# Patient Record
Sex: Female | Born: 1955 | Hispanic: No | Marital: Single
Health system: Northeastern US, Academic
[De-identification: ages and names within clinical notes are randomized; demographics above are authoritative.]

## PROBLEM LIST (undated history)

## (undated) DIAGNOSIS — L309 Dermatitis, unspecified: Secondary | ICD-10-CM

## (undated) DIAGNOSIS — R011 Cardiac murmur, unspecified: Secondary | ICD-10-CM

## (undated) DIAGNOSIS — G4700 Insomnia, unspecified: Secondary | ICD-10-CM

## (undated) DIAGNOSIS — K219 Gastro-esophageal reflux disease without esophagitis: Secondary | ICD-10-CM

## (undated) DIAGNOSIS — M419 Scoliosis, unspecified: Secondary | ICD-10-CM

## (undated) DIAGNOSIS — E785 Hyperlipidemia, unspecified: Secondary | ICD-10-CM

## (undated) DIAGNOSIS — I341 Nonrheumatic mitral (valve) prolapse: Secondary | ICD-10-CM

## (undated) DIAGNOSIS — H409 Unspecified glaucoma: Secondary | ICD-10-CM

## (undated) DIAGNOSIS — J189 Pneumonia, unspecified organism: Secondary | ICD-10-CM

## (undated) DIAGNOSIS — I10 Essential (primary) hypertension: Secondary | ICD-10-CM

## (undated) DIAGNOSIS — R0602 Shortness of breath: Secondary | ICD-10-CM

## (undated) HISTORY — DX: Insomnia, unspecified: G47.00

## (undated) HISTORY — DX: Essential (primary) hypertension: I10

## (undated) HISTORY — PX: ANAL FISSURE REPAIR: SHX2312

## (undated) HISTORY — DX: Hyperlipidemia, unspecified: E78.5

## (undated) HISTORY — DX: Scoliosis, unspecified: M41.9

## (undated) HISTORY — DX: Nonrheumatic mitral (valve) prolapse: I34.1

## (undated) HISTORY — PX: FINGER SURGERY: SHX640

---

## 2006-07-23 ENCOUNTER — Encounter (INDEPENDENT_AMBULATORY_CARE_PROVIDER_SITE_OTHER): Payer: Self-pay | Admitting: Family Medicine

## 2006-07-23 LAB — CONVERTED CEMR LAB

## 2006-10-06 ENCOUNTER — Emergency Department (HOSPITAL_COMMUNITY): Admission: EM | Admit: 2006-10-06 | Discharge: 2006-10-06 | Payer: Self-pay | Admitting: Emergency Medicine

## 2006-11-15 ENCOUNTER — Ambulatory Visit: Payer: Self-pay | Admitting: Family Medicine

## 2006-11-19 ENCOUNTER — Ambulatory Visit: Payer: Self-pay | Admitting: *Deleted

## 2007-01-14 ENCOUNTER — Ambulatory Visit: Payer: Self-pay | Admitting: Family Medicine

## 2007-04-18 ENCOUNTER — Ambulatory Visit: Payer: Self-pay | Admitting: Internal Medicine

## 2007-04-25 ENCOUNTER — Ambulatory Visit: Payer: Self-pay | Admitting: Internal Medicine

## 2007-05-20 ENCOUNTER — Ambulatory Visit: Payer: Self-pay | Admitting: Family Medicine

## 2007-05-20 LAB — TB SKIN TEST
Induration: NEGATIVE mm
TB Skin Test: NEGATIVE

## 2007-05-22 ENCOUNTER — Ambulatory Visit: Payer: Self-pay | Admitting: Internal Medicine

## 2007-06-04 ENCOUNTER — Ambulatory Visit: Payer: Self-pay | Admitting: Internal Medicine

## 2007-06-05 ENCOUNTER — Encounter (INDEPENDENT_AMBULATORY_CARE_PROVIDER_SITE_OTHER): Payer: Self-pay | Admitting: Internal Medicine

## 2007-06-05 LAB — CONVERTED CEMR LAB
AST: 22 units/L (ref 0–37)
BUN: 10 mg/dL (ref 6–23)
CO2: 26 meq/L (ref 19–32)
Creatinine, Ser: 0.69 mg/dL (ref 0.40–1.20)
Potassium: 3.9 meq/L (ref 3.5–5.3)
Total Bilirubin: 0.5 mg/dL (ref 0.3–1.2)
Total CHOL/HDL Ratio: 3.7
Total Protein: 7.4 g/dL (ref 6.0–8.3)
Triglycerides: 106 mg/dL (ref <150)

## 2007-07-10 ENCOUNTER — Encounter (INDEPENDENT_AMBULATORY_CARE_PROVIDER_SITE_OTHER): Payer: Self-pay | Admitting: *Deleted

## 2007-07-22 ENCOUNTER — Ambulatory Visit: Payer: Self-pay | Admitting: Internal Medicine

## 2007-07-22 ENCOUNTER — Ambulatory Visit (HOSPITAL_COMMUNITY): Admission: RE | Admit: 2007-07-22 | Discharge: 2007-07-22 | Payer: Self-pay | Admitting: Family Medicine

## 2007-07-24 ENCOUNTER — Emergency Department (HOSPITAL_COMMUNITY): Admission: EM | Admit: 2007-07-24 | Discharge: 2007-07-24 | Payer: Self-pay | Admitting: Family Medicine

## 2007-07-29 ENCOUNTER — Encounter (INDEPENDENT_AMBULATORY_CARE_PROVIDER_SITE_OTHER): Payer: Self-pay | Admitting: Family Medicine

## 2007-07-29 DIAGNOSIS — I059 Rheumatic mitral valve disease, unspecified: Secondary | ICD-10-CM | POA: Insufficient documentation

## 2007-07-29 DIAGNOSIS — M4180 Other forms of scoliosis, site unspecified: Secondary | ICD-10-CM

## 2007-07-29 DIAGNOSIS — E785 Hyperlipidemia, unspecified: Secondary | ICD-10-CM

## 2007-07-29 DIAGNOSIS — F17200 Nicotine dependence, unspecified, uncomplicated: Secondary | ICD-10-CM

## 2007-07-29 DIAGNOSIS — I10 Essential (primary) hypertension: Secondary | ICD-10-CM | POA: Insufficient documentation

## 2007-07-29 DIAGNOSIS — H409 Unspecified glaucoma: Secondary | ICD-10-CM | POA: Insufficient documentation

## 2007-08-18 ENCOUNTER — Emergency Department (HOSPITAL_COMMUNITY): Admission: EM | Admit: 2007-08-18 | Discharge: 2007-08-18 | Payer: Self-pay | Admitting: Family Medicine

## 2007-09-09 ENCOUNTER — Ambulatory Visit: Payer: Self-pay | Admitting: Internal Medicine

## 2007-09-18 ENCOUNTER — Encounter (INDEPENDENT_AMBULATORY_CARE_PROVIDER_SITE_OTHER): Payer: Self-pay | Admitting: Family Medicine

## 2007-09-18 ENCOUNTER — Ambulatory Visit: Payer: Self-pay | Admitting: Internal Medicine

## 2007-09-18 LAB — CONVERTED CEMR LAB
Cholesterol: 190 mg/dL (ref 0–200)
VLDL: 22 mg/dL (ref 0–40)

## 2007-10-28 ENCOUNTER — Ambulatory Visit: Payer: Self-pay | Admitting: Family Medicine

## 2007-11-08 ENCOUNTER — Emergency Department (HOSPITAL_COMMUNITY): Admission: EM | Admit: 2007-11-08 | Discharge: 2007-11-08 | Payer: Self-pay | Admitting: Emergency Medicine

## 2007-11-27 ENCOUNTER — Encounter: Admission: RE | Admit: 2007-11-27 | Discharge: 2008-01-08 | Payer: Self-pay | Admitting: Family Medicine

## 2008-02-10 ENCOUNTER — Ambulatory Visit: Payer: Self-pay | Admitting: Internal Medicine

## 2008-02-17 ENCOUNTER — Encounter (INDEPENDENT_AMBULATORY_CARE_PROVIDER_SITE_OTHER): Payer: Self-pay | Admitting: Family Medicine

## 2008-02-17 ENCOUNTER — Ambulatory Visit: Payer: Self-pay | Admitting: Internal Medicine

## 2008-02-17 LAB — CONVERTED CEMR LAB
HDL: 50 mg/dL (ref >39)
Total CHOL/HDL Ratio: 2.9
Triglycerides: 69 mg/dL (ref <150)
VLDL: 14 mg/dL (ref 0–40)

## 2008-04-17 ENCOUNTER — Ambulatory Visit: Payer: Self-pay | Admitting: Internal Medicine

## 2008-05-13 ENCOUNTER — Ambulatory Visit: Payer: Self-pay | Admitting: Internal Medicine

## 2008-05-13 ENCOUNTER — Encounter (INDEPENDENT_AMBULATORY_CARE_PROVIDER_SITE_OTHER): Payer: Self-pay | Admitting: Nurse Practitioner

## 2008-05-13 LAB — CONVERTED CEMR LAB: LDL Cholesterol: 96 mg/dL (ref 0–99)

## 2008-05-13 LAB — TB SKIN TEST: Induration: NEGATIVE mm

## 2008-05-15 ENCOUNTER — Ambulatory Visit: Payer: Self-pay | Admitting: Internal Medicine

## 2008-05-23 ENCOUNTER — Ambulatory Visit: Payer: Self-pay | Admitting: Internal Medicine

## 2008-05-23 ENCOUNTER — Encounter: Payer: Self-pay | Admitting: Internal Medicine

## 2008-09-25 ENCOUNTER — Emergency Department (HOSPITAL_COMMUNITY): Admission: EM | Admit: 2008-09-25 | Discharge: 2008-09-25 | Payer: Self-pay | Admitting: Family Medicine

## 2008-12-22 ENCOUNTER — Ambulatory Visit: Payer: Self-pay | Admitting: Internal Medicine

## 2008-12-22 ENCOUNTER — Encounter (INDEPENDENT_AMBULATORY_CARE_PROVIDER_SITE_OTHER): Payer: Self-pay | Admitting: Adult Health

## 2008-12-22 LAB — CONVERTED CEMR LAB
ALT: 25 units/L (ref 0–35)
Band Neutrophils: 0 % (ref 0–10)
Basophils Relative: 1 % (ref 0–1)
CO2: 26 meq/L (ref 19–32)
Chloride: 104 meq/L (ref 96–112)
Eosinophils Relative: 3 % (ref 0–5)
Glucose, Bld: 93 mg/dL (ref 70–99)
HCT: 37.7 % (ref 36.0–46.0)
Hemoglobin: 13.2 g/dL (ref 12.0–15.0)
MCHC: 35 g/dL (ref 30.0–36.0)
MCV: 87.3 fL (ref 78.0–100.0)
Magnesium: 2 mg/dL (ref 1.5–2.5)
Neutro Abs: 3.1 10*3/uL (ref 1.7–7.7)
Neutrophils Relative %: 46 % (ref 43–77)
Platelets: 237 10*3/uL (ref 150–400)
Total Protein: 7 g/dL (ref 6.0–8.3)
Vit D, 25-Hydroxy: 22 ng/mL — ABNORMAL LOW (ref 30–89)
WBC: 6.6 10*3/uL (ref 4.0–10.5)

## 2008-12-25 ENCOUNTER — Ambulatory Visit (HOSPITAL_COMMUNITY): Admission: RE | Admit: 2008-12-25 | Discharge: 2008-12-25 | Payer: Self-pay | Admitting: Family Medicine

## 2009-01-07 ENCOUNTER — Ambulatory Visit (HOSPITAL_COMMUNITY): Admission: RE | Admit: 2009-01-07 | Discharge: 2009-01-07 | Payer: Self-pay | Admitting: Internal Medicine

## 2009-01-07 LAB — PULMONARY FUNCTION TEST

## 2009-02-07 ENCOUNTER — Emergency Department (HOSPITAL_COMMUNITY): Admission: EM | Admit: 2009-02-07 | Discharge: 2009-02-07 | Payer: Self-pay | Admitting: Emergency Medicine

## 2009-02-23 ENCOUNTER — Emergency Department (HOSPITAL_COMMUNITY): Admission: EM | Admit: 2009-02-23 | Discharge: 2009-02-23 | Payer: Self-pay | Admitting: Emergency Medicine

## 2009-03-24 ENCOUNTER — Encounter (INDEPENDENT_AMBULATORY_CARE_PROVIDER_SITE_OTHER): Payer: Self-pay | Admitting: Adult Health

## 2009-03-24 ENCOUNTER — Ambulatory Visit: Payer: Self-pay | Admitting: Internal Medicine

## 2009-06-10 ENCOUNTER — Emergency Department (HOSPITAL_COMMUNITY): Admission: EM | Admit: 2009-06-10 | Discharge: 2009-06-10 | Payer: Self-pay | Admitting: Emergency Medicine

## 2009-07-26 ENCOUNTER — Encounter (INDEPENDENT_AMBULATORY_CARE_PROVIDER_SITE_OTHER): Payer: Self-pay | Admitting: Adult Health

## 2009-07-26 ENCOUNTER — Ambulatory Visit: Payer: Self-pay | Admitting: Internal Medicine

## 2009-07-26 LAB — CONVERTED CEMR LAB
ALT: 22 units/L (ref 0–35)
AST: 25 units/L (ref 0–37)
Alkaline Phosphatase: 85 units/L (ref 39–117)
BUN: 13 mg/dL (ref 6–23)
Barbiturate Quant, Ur: NEGATIVE
Benzodiazepines.: NEGATIVE
Chloride: 105 meq/L (ref 96–112)
Cocaine Metabolites: NEGATIVE
Creatinine, Ser: 1.01 mg/dL (ref 0.40–1.20)
Marijuana Metabolite: NEGATIVE
Methadone: NEGATIVE
Opiate Screen, Urine: NEGATIVE
Sodium: 143 meq/L (ref 135–145)
Total Bilirubin: 0.4 mg/dL (ref 0.3–1.2)

## 2009-08-17 ENCOUNTER — Ambulatory Visit: Payer: Self-pay | Admitting: Internal Medicine

## 2009-09-27 ENCOUNTER — Ambulatory Visit: Payer: Self-pay | Admitting: Internal Medicine

## 2009-09-27 ENCOUNTER — Encounter (INDEPENDENT_AMBULATORY_CARE_PROVIDER_SITE_OTHER): Payer: Self-pay | Admitting: Adult Health

## 2009-09-27 LAB — CONVERTED CEMR LAB
ALT: 23 units/L (ref 0–35)
Chloride: 105 meq/L (ref 96–112)
Glucose, Bld: 110 mg/dL — ABNORMAL HIGH (ref 70–99)
HDL: 56 mg/dL (ref >39)
LDL Cholesterol: 97 mg/dL (ref 0–99)
Potassium: 3.6 meq/L (ref 3.5–5.3)
Sodium: 141 meq/L (ref 135–145)
Total CHOL/HDL Ratio: 3.1
Total Protein: 7.3 g/dL (ref 6.0–8.3)
VLDL: 20 mg/dL (ref 0–40)

## 2009-10-06 ENCOUNTER — Emergency Department (HOSPITAL_COMMUNITY): Admission: EM | Admit: 2009-10-06 | Discharge: 2009-10-07 | Payer: Self-pay | Admitting: Emergency Medicine

## 2009-10-11 ENCOUNTER — Ambulatory Visit: Payer: Self-pay | Admitting: Family Medicine

## 2009-10-11 ENCOUNTER — Encounter (INDEPENDENT_AMBULATORY_CARE_PROVIDER_SITE_OTHER): Payer: Self-pay | Admitting: Adult Health

## 2009-10-11 LAB — CONVERTED CEMR LAB: Hgb A1c MFr Bld: 6.2 % — ABNORMAL HIGH (ref 4.6–6.1)

## 2009-11-10 ENCOUNTER — Ambulatory Visit: Payer: Self-pay | Admitting: Internal Medicine

## 2010-01-13 ENCOUNTER — Ambulatory Visit: Payer: Self-pay | Admitting: Internal Medicine

## 2010-02-15 ENCOUNTER — Ambulatory Visit: Payer: Self-pay | Admitting: Internal Medicine

## 2010-03-20 ENCOUNTER — Emergency Department (HOSPITAL_COMMUNITY): Admission: EM | Admit: 2010-03-20 | Discharge: 2010-03-21 | Payer: Self-pay | Admitting: Emergency Medicine

## 2010-07-13 ENCOUNTER — Encounter (INDEPENDENT_AMBULATORY_CARE_PROVIDER_SITE_OTHER): Payer: Self-pay | Admitting: Internal Medicine

## 2010-07-13 LAB — CONVERTED CEMR LAB
Albumin: 4.4 g/dL (ref 3.5–5.2)
Alkaline Phosphatase: 81 units/L (ref 39–117)
Calcium: 10 mg/dL (ref 8.4–10.5)
Chloride: 103 meq/L (ref 96–112)
Cholesterol: 144 mg/dL (ref 0–200)
Hgb A1c MFr Bld: 6.4 % — ABNORMAL HIGH (ref <5.7)
Sodium: 140 meq/L (ref 135–145)
Total CHOL/HDL Ratio: 2.9
Total Protein: 7.2 g/dL (ref 6.0–8.3)
Triglycerides: 97 mg/dL (ref <150)
VLDL: 19 mg/dL (ref 0–40)

## 2010-11-07 ENCOUNTER — Emergency Department (HOSPITAL_COMMUNITY)
Admission: EM | Admit: 2010-11-07 | Discharge: 2010-11-07 | Payer: Self-pay | Source: Home / Self Care | Admitting: Emergency Medicine

## 2010-11-11 ENCOUNTER — Emergency Department (HOSPITAL_COMMUNITY)
Admission: EM | Admit: 2010-11-11 | Discharge: 2010-11-11 | Payer: Self-pay | Source: Home / Self Care | Admitting: Family Medicine

## 2010-11-14 ENCOUNTER — Emergency Department (HOSPITAL_COMMUNITY)
Admission: EM | Admit: 2010-11-14 | Discharge: 2010-11-14 | Payer: Self-pay | Source: Home / Self Care | Admitting: Family Medicine

## 2011-01-09 LAB — BASIC METABOLIC PANEL
Chloride: 105 mEq/L (ref 96–112)
Creatinine, Ser: 0.76 mg/dL (ref 0.4–1.2)
GFR calc Af Amer: >60 mL/min (ref >60)
Sodium: 140 mEq/L (ref 135–145)

## 2011-01-09 LAB — CBC
HCT: 38.9 % (ref 36.0–46.0)
RDW: 13.7 % (ref 11.5–15.5)

## 2011-01-09 LAB — DIFFERENTIAL
Basophils Absolute: 0.1 10*3/uL (ref 0.0–0.1)
Basophils Relative: 2 % — ABNORMAL HIGH (ref 0–1)
Eosinophils Absolute: 0.3 10*3/uL (ref 0.0–0.7)
Eosinophils Relative: 3 % (ref 0–5)
Lymphocytes Relative: 42 % (ref 12–46)
Neutrophils Relative %: 47 % (ref 43–77)

## 2011-05-04 ENCOUNTER — Inpatient Hospital Stay (INDEPENDENT_AMBULATORY_CARE_PROVIDER_SITE_OTHER)
Admission: RE | Admit: 2011-05-04 | Discharge: 2011-05-04 | Disposition: A | Discharge status: Home / Self Care | Payer: Self-pay | Source: Ambulatory Visit | Attending: Family Medicine | Admitting: Family Medicine

## 2011-05-04 DIAGNOSIS — R51 Headache: Secondary | ICD-10-CM

## 2011-05-04 DIAGNOSIS — I10 Essential (primary) hypertension: Secondary | ICD-10-CM

## 2011-07-24 ENCOUNTER — Emergency Department (HOSPITAL_COMMUNITY): Payer: Managed Care, Other (non HMO)

## 2011-07-24 ENCOUNTER — Emergency Department (HOSPITAL_COMMUNITY)
Admission: EM | Admit: 2011-07-24 | Discharge: 2011-07-25 | Disposition: A | Discharge status: Home / Self Care | Payer: Managed Care, Other (non HMO) | Attending: Emergency Medicine | Admitting: Emergency Medicine

## 2011-07-24 DIAGNOSIS — I10 Essential (primary) hypertension: Secondary | ICD-10-CM | POA: Insufficient documentation

## 2011-07-24 DIAGNOSIS — E7800 Pure hypercholesterolemia, unspecified: Secondary | ICD-10-CM | POA: Insufficient documentation

## 2011-07-24 DIAGNOSIS — J45909 Unspecified asthma, uncomplicated: Secondary | ICD-10-CM | POA: Insufficient documentation

## 2011-07-24 DIAGNOSIS — R109 Unspecified abdominal pain: Secondary | ICD-10-CM | POA: Insufficient documentation

## 2011-07-24 DIAGNOSIS — F17200 Nicotine dependence, unspecified, uncomplicated: Secondary | ICD-10-CM | POA: Insufficient documentation

## 2011-07-24 DIAGNOSIS — K654 Sclerosing mesenteritis: Secondary | ICD-10-CM | POA: Insufficient documentation

## 2011-07-24 DIAGNOSIS — M4120 Other idiopathic scoliosis, site unspecified: Secondary | ICD-10-CM | POA: Insufficient documentation

## 2011-07-24 LAB — DIFFERENTIAL
Basophils Absolute: 0.1 10*3/uL (ref 0.0–0.1)
Basophils Relative: 1 % (ref 0–1)
Monocytes Relative: 7 % (ref 3–12)
Neutro Abs: 5.5 10*3/uL (ref 1.7–7.7)
Neutrophils Relative %: 56 % (ref 43–77)

## 2011-07-24 LAB — POCT I-STAT, CHEM 8
BUN: 9 mg/dL (ref 6–23)
Calcium, Ion: 1.19 mmol/L (ref 1.12–1.32)
Chloride: 107 meq/L (ref 96–112)
Creatinine, Ser: 0.5 mg/dL (ref 0.50–1.10)
Glucose, Bld: 132 mg/dL — ABNORMAL HIGH (ref 70–99)
HCT: 35 % — ABNORMAL LOW (ref 36.0–46.0)
Hemoglobin: 11.9 g/dL — ABNORMAL LOW (ref 12.0–15.0)
Potassium: 3.5 meq/L (ref 3.5–5.1)
Sodium: 142 mEq/L (ref 135–145)
TCO2: 26 mmol/L (ref 0–100)

## 2011-07-24 LAB — URINE MICROSCOPIC-ADD ON

## 2011-07-24 LAB — CBC
Hemoglobin: 12.1 g/dL (ref 12.0–15.0)
RBC: 3.82 MIL/uL — ABNORMAL LOW (ref 3.87–5.11)
WBC: 9.8 10*3/uL (ref 4.0–10.5)

## 2011-07-24 LAB — URINALYSIS, ROUTINE W REFLEX MICROSCOPIC
Hgb urine dipstick: NEGATIVE
Ketones, ur: 15 mg/dL — AB
Protein, ur: NEGATIVE mg/dL
Specific Gravity, Urine: 1.031 — ABNORMAL HIGH (ref 1.005–1.030)

## 2011-07-25 LAB — TB SKIN TEST: TB Skin Test: NEGATIVE

## 2011-07-25 LAB — HEPATIC FUNCTION PANEL
ALT: 29 U/L (ref 0–35)
AST: 28 U/L (ref 0–37)
Albumin: 3.4 g/dL — ABNORMAL LOW (ref 3.5–5.2)
Total Protein: 6.9 g/dL (ref 6.0–8.3)

## 2011-07-25 LAB — LACTIC ACID, PLASMA: Lactic Acid, Venous: 1.1 mmol/L (ref 0.5–2.2)

## 2011-07-25 MED ORDER — IOHEXOL 300 MG/ML  SOLN: 80.0000 mL | Freq: Once | INTRAMUSCULAR | Status: DC | PRN

## 2011-08-02 ENCOUNTER — Encounter (INDEPENDENT_AMBULATORY_CARE_PROVIDER_SITE_OTHER): Payer: Self-pay | Admitting: Surgery

## 2011-08-03 ENCOUNTER — Ambulatory Visit (INDEPENDENT_AMBULATORY_CARE_PROVIDER_SITE_OTHER): Payer: Managed Care, Other (non HMO) | Admitting: Surgery

## 2011-08-03 ENCOUNTER — Encounter (INDEPENDENT_AMBULATORY_CARE_PROVIDER_SITE_OTHER): Payer: Self-pay | Admitting: Surgery

## 2011-08-03 VITALS — BP 132/78 | HR 84 | Temp 96.7°F | Resp 16 | Ht 67.75 in | Wt 153.0 lb

## 2011-08-03 DIAGNOSIS — K654 Sclerosing mesenteritis: Secondary | ICD-10-CM

## 2011-08-03 NOTE — Patient Instructions (Signed)
Stay well-hydrated.  Use ibuprofen or aleve to help with the soreness.

## 2011-08-03 NOTE — Progress Notes (Signed)
Chief Complaint  Patient presents with  . Abdominal Pain    HPI Annette Andrade is a 55 y.o. female.  She is referred by Dr. Eber Hong from the emergency department for evaluation of periumbilical abdominal pain. The patient presents with a two-week history of some upper abdominal pain extending down to her umbilicus. This began when she was on vacation. The patient was quite severe when she went to the emergency department on October 1. The pain has improved but is still present. The pain seems to be localized above her umbilicus and to the right of her umbilicus. HPI  Past Medical History  Diagnosis Date  . Asthma   . Hypertension   . Hyperlipidemia   . Scoliosis   . Insomnia   . Mitral valve prolapse     Past Surgical History  Procedure Date  . Finger surgery 55 years old    does not remember why, also has scar on side of abdomen  Left abdominal scar - unknown whether this represents a surgery as a child.  Family History  Problem Relation Age of Onset  . Colon cancer Paternal Uncle   . Throat cancer Maternal Aunt   . Stroke Mother   . Heart attack Father     Social History History  Substance Use Topics  . Smoking status: Current Everyday Smoker -- 0.3 packs/day  . Smokeless tobacco: Not on file  . Alcohol Use: Yes     rarely    No Known Allergies  Current Outpatient Prescriptions  Medication Sig Dispense Refill  . Ascorbic Acid (VITAMIN C) 100 MG tablet Take 100 mg by mouth daily.        Marland Kitchen atorvastatin (LIPITOR) 40 MG tablet Take 40 mg by mouth daily.        . Calcium Carb-Cholecalciferol (OYSTER SHELL CALCIUM + D PO) Take by mouth daily.        . cyclobenzaprine (FLEXERIL) 10 MG tablet Take 10 mg by mouth 3 (three) times daily as needed.        Marland Kitchen FLAXSEED, LINSEED, PO Take by mouth daily.        Marland Kitchen lisinopril-hydrochlorothiazide (PRINZIDE,ZESTORETIC) 20-25 MG per tablet Take 1 tablet by mouth daily.        . Multiple Vitamin (MULTIVITAMIN PO) Take by mouth  daily.        . Thiamine HCl (VITAMIN B-1) 100 MG tablet Take 100 mg by mouth daily.        . traZODone (DESYREL) 50 MG tablet Take 50 mg by mouth at bedtime.          Review of Systems Review of Systems ROS is otherwise negative. Blood pressure 132/78, pulse 84, temperature 96.7 F (35.9 C), temperature source Temporal, resp. rate 16, height 5' 7.75" (1.721 m), weight 153 lb (69.4 kg).  Physical Exam Physical Exam WDWN in NAD HEENT:  EOMI, sclera anicteric Neck:  No masses, no thyromegaly Lungs:  CTA bilaterally; normal respiratory effort CV:  Regular rate and rhythm; no murmurs Abd:  +bowel sounds, soft, no sign of ventral or umbilical hernia; mild tenderness in the supraumbilical area and right of the umbilicus Ext:  Well-perfused; no edema Skin:  Warm, dry; no sign of jaundice  Data Reviewed CT Abd/Pelvis 10/1 - focal area of fat necrosis in the omentum near the umbilicus just to the right of midline - 2.4 cm  Assessment    Omental fat necrosis with improving abdominal pain    Plan    Recommend  conservative treatment at this time - maintain hydration, NSAID's.  Recheck in 2-3 weeks.  The patient was also noted to have some bulging lumbar discs at L4-5 and L5-S1.  I gave her a copy of her CT report to discuss with her PCM.         Sydny Schnitzler K. 08/03/2011, 10:41 AM

## 2011-08-08 ENCOUNTER — Encounter (INDEPENDENT_AMBULATORY_CARE_PROVIDER_SITE_OTHER): Payer: Self-pay | Admitting: Surgery

## 2011-08-09 ENCOUNTER — Inpatient Hospital Stay (INDEPENDENT_AMBULATORY_CARE_PROVIDER_SITE_OTHER)
Admission: RE | Admit: 2011-08-09 | Discharge: 2011-08-09 | Disposition: A | Discharge status: Home / Self Care | Payer: Managed Care, Other (non HMO) | Source: Ambulatory Visit | Attending: Family Medicine | Admitting: Family Medicine

## 2011-08-09 DIAGNOSIS — N760 Acute vaginitis: Secondary | ICD-10-CM

## 2011-08-09 LAB — WET PREP, GENITAL: Yeast Wet Prep HPF POC: NONE SEEN

## 2011-08-09 LAB — POCT URINALYSIS DIP (DEVICE)
Bilirubin Urine: NEGATIVE
Glucose, UA: NEGATIVE mg/dL
Specific Gravity, Urine: 1.025 (ref 1.005–1.030)

## 2011-08-10 LAB — GC/CHLAMYDIA PROBE AMP, GENITAL
Chlamydia, DNA Probe: NEGATIVE
GC Probe Amp, Genital: NEGATIVE

## 2011-08-24 ENCOUNTER — Encounter (INDEPENDENT_AMBULATORY_CARE_PROVIDER_SITE_OTHER): Payer: Managed Care, Other (non HMO) | Admitting: Surgery

## 2011-12-12 ENCOUNTER — Encounter: Payer: Managed Care, Other (non HMO) | Admitting: Medical

## 2011-12-13 ENCOUNTER — Encounter: Payer: Self-pay | Admitting: Medical

## 2011-12-13 ENCOUNTER — Other Ambulatory Visit: Payer: Self-pay | Admitting: Medical

## 2011-12-13 ENCOUNTER — Ambulatory Visit (INDEPENDENT_AMBULATORY_CARE_PROVIDER_SITE_OTHER): Payer: Managed Care, Other (non HMO) | Admitting: Medical

## 2011-12-13 VITALS — BP 132/80 | HR 80 | Temp 98.1°F | Resp 16 | Wt 151.0 lb

## 2011-12-13 DIAGNOSIS — M2560 Stiffness of unspecified joint, not elsewhere classified: Secondary | ICD-10-CM

## 2011-12-13 DIAGNOSIS — M2540 Effusion, unspecified joint: Secondary | ICD-10-CM

## 2011-12-13 DIAGNOSIS — G4700 Insomnia, unspecified: Secondary | ICD-10-CM

## 2011-12-13 DIAGNOSIS — I10 Essential (primary) hypertension: Secondary | ICD-10-CM | POA: Insufficient documentation

## 2011-12-13 DIAGNOSIS — M2550 Pain in unspecified joint: Secondary | ICD-10-CM

## 2011-12-13 DIAGNOSIS — E785 Hyperlipidemia, unspecified: Secondary | ICD-10-CM

## 2011-12-13 DIAGNOSIS — J45909 Unspecified asthma, uncomplicated: Secondary | ICD-10-CM | POA: Insufficient documentation

## 2011-12-13 DIAGNOSIS — M549 Dorsalgia, unspecified: Secondary | ICD-10-CM

## 2011-12-13 MED ORDER — CYCLOBENZAPRINE HCL 10 MG PO TABS: 10.0000 mg | ORAL_TABLET | Freq: Three times a day (TID) | ORAL | Status: DC | PRN

## 2011-12-13 MED ORDER — CALCIUM CARBONATE-VITAMIN D 500-200 MG-UNIT PO TABS: 1.0000 | ORAL_TABLET | Freq: Three times a day (TID) | ORAL | Status: DC

## 2011-12-13 MED ORDER — ALBUTEROL SULFATE HFA 108 (90 BASE) MCG/ACT IN AERS: 2.0000 | INHALATION_SPRAY | Freq: Four times a day (QID) | RESPIRATORY_TRACT | Status: DC | PRN

## 2011-12-13 MED ORDER — TRAZODONE HCL 50 MG PO TABS: 50.0000 mg | ORAL_TABLET | Freq: Every day | ORAL | Status: DC

## 2011-12-13 MED ORDER — VITAMIN B-1 100 MG PO TABS: 100.0000 mg | ORAL_TABLET | Freq: Every day | ORAL | Status: DC

## 2011-12-13 MED ORDER — SIMVASTATIN 40 MG PO TABS: 40.0000 mg | ORAL_TABLET | Freq: Every evening | ORAL | Status: DC

## 2011-12-13 MED ORDER — LISINOPRIL-HYDROCHLOROTHIAZIDE 20-25 MG PO TABS: 1.0000 | ORAL_TABLET | Freq: Every day | ORAL | Status: DC

## 2011-12-13 NOTE — Progress Notes (Signed)
Subjective:   HPI  Annette Andrade is a 56 y.o. female who presents as a new patient today.  In the past year she has went to the urgent care and emergent department for care.  She was seeing [facility] a year ago for routine care.  She has several concerns today.  She wants to establish care, she needs refills on all her medications, but she has several issues .  She has history of insomnia, uses trazodone but it doesn't really help. she has used some other medications for sleep and they were better, but can't recall what they were. Denies history of sleep apnea, no prior sleep study.  She notes a chronic history of back pain and joint pains. She notes morning stiffness particularly in her hands and upper back, takes about an hour to get going. She notes occasional swelling of some of her finger joints. She notes joint pains in her knees bilateral, fingers and hands bilateral, left hip, upper and lower back pain. She has occasional pain in her low back that radiates down her left leg, and says she's been treated for sciatica in the past. She denies a history of rheumatoid arthritis, but has never been checked for this. She does note arthritis in her family.  She has primarily been using Flexeril and over-the-counter analgesics for pain.  No other c/o.  The following portions of the patient's history were reviewed and updated as appropriate: allergies, current medications, past family history, past medical history, past social history, past surgical history and problem list.  No Known Allergies  Current Outpatient Prescriptions on File Prior to Visit  Medication Sig Dispense Refill  . Ascorbic Acid (VITAMIN C) 100 MG tablet Take 100 mg by mouth daily.        Marland Kitchen FLAXSEED, LINSEED, PO Take by mouth daily.        . Multiple Vitamin (MULTIVITAMIN PO) Take by mouth daily.          Past Medical History  Diagnosis Date  . Asthma   . Hypertension   . Hyperlipidemia   . Scoliosis   .  Insomnia   . Mitral valve prolapse     Past Surgical History  Procedure Date  . Finger surgery 56 years old    does not remember why, also has scar on side of abdomen    Family History  Problem Relation Age of Onset  . Colon cancer Paternal Uncle   . Throat cancer Maternal Aunt   . Stroke Mother   . Heart attack Father     History   Social History  . Marital Status: Single    Spouse Name: N/A    Number of Children: N/A  . Years of Education: N/A   Occupational History  . Not on file.   Social History Main Topics  . Smoking status: Current Everyday Smoker -- 0.3 packs/day    Types: Cigarettes  . Smokeless tobacco: Not on file  . Alcohol Use: Yes     rarely  . Drug Use: No  . Sexually Active: Not on file   Other Topics Concern  . Not on file   Social History Narrative  . No narrative on file    Objective:   Physical Exam  General appearance: alert, no distress, WD/WN, thin black female HEENT: normocephalic, sclerae anicteric, TMs pearly, nares patent, no discharge or erythema, pharynx normal Oral cavity: MMM Neck: supple, no lymphadenopathy, no thyromegaly, no masses Heart: RRR, normal S1, S2, no obvious  murmurs Lungs: CTA bilaterally, no wheezes, rhonchi, or rales Pulses: 2+ symmetric, upper and lower extremities, normal cap refill Back: Tender along supraspinatus and trapezius bilaterally, there is mild thoracic and lumbar scoliosis with asymmetry of the upper back, mild tenderness in the lumbar region generalized, but good range of motion, negative straight-leg raise MSK: Obvious bony arthritic changes of several fingers particularly the PIPs, bilateral there seems to be some mild deviation or angulation of the finger joints particularly at the MCPs, bilateral volar third fingers at the proximal phalanx with round, brown, rough skin lesions that she attributes to prior scar from surgery, mild tenderness of bilateral knees, decreased bilateral hip internal range  of motion, otherwise lower extremity nontender, no obvious abnormality otherwise Neuro: Normal strength and sensation, nonfocal   Assessment and Plan :     Encounter Diagnoses  Name Primary?  . Polyarthralgia Yes  . Joint swelling   . Morning joint stiffness   . Essential hypertension, benign   . Back pain   . Asthma   . Hyperlipidemia   . Insomnia    Polyarthralgia, joint swelling, morning stiffness-screen for rheumatoid arthritis. Screen is negative, will consider over-the-counter NSAID for arthritis, and referral for physical therapy for joint pain and back pain  Hypertension-refilled her medication  Asthma - refilled her medication  Hyperlipidemia refill her medication  Insomnia-discussed sleep hygiene, possible cause of her insomnia, and she will continue trazodone for now   I advise she recheck in a month for a complete physical as she is also due for preventative care, labs, and referral for colonoscopy

## 2011-12-14 LAB — CYCLIC CITRUL PEPTIDE ANTIBODY, IGG: Cyclic Citrullin Peptide Ab: <2 U/mL (ref 0.0–5.0)

## 2011-12-19 ENCOUNTER — Ambulatory Visit: Payer: Managed Care, Other (non HMO) | Admitting: Internal Medicine

## 2011-12-19 DIAGNOSIS — Z0289 Encounter for other administrative examinations: Secondary | ICD-10-CM

## 2012-01-10 ENCOUNTER — Encounter: Payer: Managed Care, Other (non HMO) | Admitting: Medical

## 2012-01-22 ENCOUNTER — Emergency Department (HOSPITAL_COMMUNITY)
Admission: EM | Admit: 2012-01-22 | Discharge: 2012-01-22 | Disposition: A | Discharge status: Home / Self Care | Payer: Self-pay | Attending: Emergency Medicine | Admitting: Emergency Medicine

## 2012-01-22 ENCOUNTER — Emergency Department (HOSPITAL_COMMUNITY): Payer: Self-pay

## 2012-01-22 DIAGNOSIS — I059 Rheumatic mitral valve disease, unspecified: Secondary | ICD-10-CM | POA: Insufficient documentation

## 2012-01-22 DIAGNOSIS — E785 Hyperlipidemia, unspecified: Secondary | ICD-10-CM | POA: Insufficient documentation

## 2012-01-22 DIAGNOSIS — J45909 Unspecified asthma, uncomplicated: Secondary | ICD-10-CM | POA: Insufficient documentation

## 2012-01-22 DIAGNOSIS — I10 Essential (primary) hypertension: Secondary | ICD-10-CM | POA: Insufficient documentation

## 2012-01-22 DIAGNOSIS — Z79899 Other long term (current) drug therapy: Secondary | ICD-10-CM | POA: Insufficient documentation

## 2012-01-22 DIAGNOSIS — F17200 Nicotine dependence, unspecified, uncomplicated: Secondary | ICD-10-CM | POA: Insufficient documentation

## 2012-01-22 DIAGNOSIS — M79609 Pain in unspecified limb: Secondary | ICD-10-CM | POA: Insufficient documentation

## 2012-01-22 DIAGNOSIS — M79676 Pain in unspecified toe(s): Secondary | ICD-10-CM

## 2012-01-22 NOTE — ED Provider Notes (Signed)
History     CSN: [id number]  Arrival date & time 01/22/12  1440   First MD Initiated Contact with Patient 01/22/12 1631      Chief Complaint  Patient presents with  . Toe Injury    (Consider location/radiation/quality/duration/timing/severity/associated sxs/prior treatment) HPI Comments: Patient reports that 1.5 weeks ago she ran over her right 5th toe with a trash can and has been having pain since that time.  She is able to bear weight.  She reports that the area was swollen initially, but the swelling has improved.  She denies any erythema or bruising.  Patient is a 56 y.o. female presenting with toe pain. The history is provided by the patient.  Toe Pain This is a new problem. The problem has been gradually improving. Pertinent negatives include no chills, fever, nausea or numbness. The symptoms are aggravated by bending and walking. She has tried nothing for the symptoms.    Past Medical History  Diagnosis Date  . Asthma   . Hypertension   . Hyperlipidemia   . Scoliosis   . Insomnia   . Mitral valve prolapse     Past Surgical History  Procedure Date  . Finger surgery 56 years old    does not remember why, also has scar on side of abdomen    Family History  Problem Relation Age of Onset  . Colon cancer Paternal Uncle   . Throat cancer Maternal Aunt   . Stroke Mother   . Heart attack Father     History  Substance Use Topics  . Smoking status: Current Everyday Smoker -- 0.3 packs/day    Types: Cigarettes  . Smokeless tobacco: Not on file  . Alcohol Use: Yes     rarely    OB History    Grav Para Term Preterm Abortions TAB SAB Ect Mult Living                  Review of Systems  Constitutional: Negative for fever and chills.  Gastrointestinal: Negative for nausea.  Musculoskeletal: Negative for gait problem.  Skin: Negative for color change.  Neurological: Negative for numbness.    Allergies  Review of patient's allergies indicates no known  allergies.  Home Medications   Current Outpatient Rx  Name Route Sig Dispense Refill  . ALBUTEROL SULFATE HFA 108 (90 BASE) MCG/ACT IN AERS Inhalation Inhale 2 puffs into the lungs every 6 (six) hours as needed for wheezing. 1 Inhaler 0  . ASCORBIC ACID 500 MG PO TABS Oral Take 500 mg by mouth daily.    . CYCLOBENZAPRINE HCL 10 MG PO TABS Oral Take 1 tablet (10 mg total) by mouth 3 (three) times daily as needed. 30 tablet 2  . FLAXSEED (LINSEED) PO Oral Take by mouth daily.      Marland Kitchen LISINOPRIL-HYDROCHLOROTHIAZIDE 20-25 MG PO TABS Oral Take 1 tablet by mouth daily. 30 tablet 2  . MULTIVITAMIN PO Oral Take by mouth daily.      Marland Kitchen SIMVASTATIN 40 MG PO TABS Oral Take 1 tablet (40 mg total) by mouth every evening. 30 tablet 2  . TRAZODONE HCL 50 MG PO TABS Oral Take 1 tablet (50 mg total) by mouth at bedtime. 30 tablet 2  . VITAMIN B-12 500 MCG PO TABS Oral Take 500 mcg by mouth daily.      BP 115/72  Pulse 98  Temp(Src) 98 F (36.7 C) (Oral)  SpO2 100%  Physical Exam  Nursing note and vitals reviewed. Constitutional: She is  oriented to person, place, and time. She appears well-developed and well-nourished. No distress.  HENT:  Head: Normocephalic and atraumatic.  Cardiovascular: Normal rate, regular rhythm and normal heart sounds.   Pulses:      Dorsalis pedis pulses are 2+ on the right side.  Pulmonary/Chest: Effort normal and breath sounds normal.  Musculoskeletal:       Tenderness to palpation of the 5th right MTP.  No swelling, no erythema.  Pain with ROM of 5th right MTP.    Neurological: She is alert and oriented to person, place, and time. No sensory deficit.  Skin: Skin is warm and dry. No bruising and no ecchymosis noted. She is not diaphoretic. No erythema.  Psychiatric: She has a normal mood and affect.    ED Course  Procedures (including critical care time)  Labs Reviewed - No data to display Dg Foot Complete Right  01/22/2012  *RADIOLOGY REPORT*  Clinical Data: Blunt  trauma to the right foot several days ago  RIGHT FOOT COMPLETE - 3+ VIEW  Comparison: Right foot films of 06/10/2009  Findings: No acute fracture is seen.  Tarsal - metatarsal alignment is normal.  There is degenerative change at the articulation of the navicula with the first cuneiform and tarsal coalition cannot be excluded.  Joint spaces otherwise are normal.  IMPRESSION: No acute abnormality.  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  Original Report Authenticated By: Juline Patch, M.D.     1. Toe pain       MDM  Patient neurovascularly intact.  Xray negative for acute abnormality.  Toe buddy taped and patient given RICE instructions.          Pascal Lux [place], PA-C 01/23/12 [phone]

## 2012-01-22 NOTE — Discharge Instructions (Signed)
Be sure to read and understand instructions below prior to leaving the hospital. If your symptoms persist without any improvement in a couple of days it is recommended that you follow up with your primary care physician  TREATMENT  Rest, ice, elevation, and compression are the basic modes of treatment.   Apply ice to the sore area for 15 to 20 minutes, 3 to 4 times per day. Do this while you are awake for the first 2 days, or as directed. This can be stopped when the swelling goes away. Put the ice in a plastic bag and place a towel between the bag of ice and your skin.  Keep your leg elevated when possible to lessen swelling.  If your caregiver recommends crutches, use them as instructed for 1 week. Then, you may walk as tolerated.  Do not drive a vehicle on pain medication. ACTIVITY:            - Weight bearing as tolerated            - Exercises should be limited to pain free range of motion              SEEK MEDICAL CARE IF:  You have an increase in bruising, swelling, or pain.  Your toes feel cold.  Pain relief is not achieved with medications.  EMERGENCY:: Your toes are numb or blue or you have severe pain.  You notice redness, swelling, warmth or increasing pain in your knee.  An unexplained oral temperature above 102 F (38.9 C) develops.  COLD THERAPY DIRECTIONS:  Ice or gel packs can be used to reduce both pain and swelling. Ice is the most helpful within the first 24 to 48 hours after an injury or flareup from overusing a muscle or joint.  Ice is effective, has very few side effects, and is safe for most people to use.   If you expose your skin to cold temperatures for too long or without the proper protection, you can damage your skin or nerves. Watch for signs of skin damage due to cold.   HOME CARE INSTRUCTIONS  Follow these tips to use ice and cold packs safely.  Place a dry or damp towel between the ice and skin. A damp towel will cool the skin more quickly, so you may  need to shorten the time that the ice is used.  For a more rapid response, add gentle compression to the ice.  Ice for no more than 10 to 20 minutes at a time. The bonier the area you are icing, the less time it will take to get the benefits of ice.  Check your skin after 5 minutes to make sure there are no signs of a poor response to cold or skin damage.  Rest 20 minutes or more in between uses.  Once your skin is numb, you can end your treatment. You can test numbness by very lightly touching your skin. The touch should be so light that you do not see the skin dimple from the pressure of your fingertip. When using ice, most people will feel these normal sensations in this order: cold, burning, aching, and numbness.  Do not use ice on someone who cannot communicate their responses to pain, such as small children or people with dementia.   HOW TO MAKE AN ICE PACK  To make an ice pack, do one of the following:  Place crushed ice or a bag of frozen vegetables in a sealable plastic bag. Squeeze  out the excess air. Place this bag inside another plastic bag. Slide the bag into a pillowcase or place a damp towel between your skin and the bag.  Mix 3 parts water with 1 part rubbing alcohol. Freeze the mixture in a sealable plastic bag. When you remove the mixture from the freezer, it will be slushy. Squeeze out the excess air. Place this bag inside another plastic bag. Slide the bag into a pillowcase or place a damp towel between your s

## 2012-01-22 NOTE — ED Notes (Signed)
Pt. Ran over her right 5th toe with a trash can last week.  She is having continued pain and swelling.

## 2012-01-23 ENCOUNTER — Other Ambulatory Visit: Payer: Self-pay | Admitting: Medical

## 2012-01-23 NOTE — ED Provider Notes (Signed)
Medical screening examination/treatment/procedure(s) were performed by non-physician practitioner and as supervising physician I was immediately available for consultation/collaboration.  Hurman Horn, MD 01/23/12 2114

## 2012-02-23 ENCOUNTER — Encounter (HOSPITAL_COMMUNITY): Payer: Self-pay | Admitting: *Deleted

## 2012-02-23 ENCOUNTER — Emergency Department (INDEPENDENT_AMBULATORY_CARE_PROVIDER_SITE_OTHER): Payer: Self-pay

## 2012-02-23 ENCOUNTER — Emergency Department (INDEPENDENT_AMBULATORY_CARE_PROVIDER_SITE_OTHER)
Admission: EM | Admit: 2012-02-23 | Discharge: 2012-02-23 | Disposition: A | Discharge status: Home / Self Care | Payer: Self-pay | Source: Home / Self Care | Attending: Family Medicine | Admitting: Family Medicine

## 2012-02-23 DIAGNOSIS — S92919A Unspecified fracture of unspecified toe(s), initial encounter for closed fracture: Secondary | ICD-10-CM

## 2012-02-23 DIAGNOSIS — S92501A Displaced unspecified fracture of right lesser toe(s), initial encounter for closed fracture: Secondary | ICD-10-CM

## 2012-02-23 MED ORDER — HYDROCODONE-ACETAMINOPHEN 5-325 MG PO TABS
ORAL_TABLET | ORAL | Status: AC
  Filled 2012-02-23: qty 1

## 2012-02-23 MED ORDER — HYDROCODONE-ACETAMINOPHEN 7.5-325 MG PO TABS: 1.0000 | ORAL_TABLET | Freq: Four times a day (QID) | ORAL | Status: AC | PRN

## 2012-02-23 MED ORDER — HYDROCODONE-ACETAMINOPHEN 5-325 MG PO TABS
1.0000 | ORAL_TABLET | Freq: Once | ORAL | Status: AC
  Administered 2012-02-23: 1 via ORAL

## 2012-02-23 NOTE — ED Notes (Signed)
Pt  Reports  She  inj  Her r  Small  Toe  Today        She  Has   Pain     On the  Affected  Foot  And  Toe     The  Toe  Is  Swollen        And  painfull  To  The  Touch

## 2012-02-23 NOTE — Discharge Instructions (Signed)
Ice pack , wear shoe and tape for comfort , activity as tolerated, see orthopedist if further problems.

## 2012-02-23 NOTE — ED Provider Notes (Signed)
History     CSN: [id number]  Arrival date & time 02/23/12  1744   First MD Initiated Contact with Patient 02/23/12 1806      Chief Complaint  Patient presents with  . Toe Injury    (Consider location/radiation/quality/duration/timing/severity/associated sxs/prior treatment) Patient is a 56 y.o. female presenting with toe pain. The history is provided by the patient.  Toe Pain This is a new problem. The current episode started 6 to 12 hours ago (struck against bed post to right 5th toe this am). The problem has been gradually worsening.    Past Medical History  Diagnosis Date  . Asthma   . Hypertension   . Hyperlipidemia   . Scoliosis   . Insomnia   . Mitral valve prolapse     Past Surgical History  Procedure Date  . Finger surgery 56 years old    does not remember why, also has scar on side of abdomen    Family History  Problem Relation Age of Onset  . Colon cancer Paternal Uncle   . Throat cancer Maternal Aunt   . Stroke Mother   . Heart attack Father     History  Substance Use Topics  . Smoking status: Current Everyday Smoker -- 0.3 packs/day    Types: Cigarettes  . Smokeless tobacco: Not on file  . Alcohol Use: Yes     rarely    OB History    Grav Para Term Preterm Abortions TAB SAB Ect Mult Living                  Review of Systems  Constitutional: Negative.   Musculoskeletal: Positive for joint swelling and gait problem.    Allergies  Review of patient's allergies indicates no known allergies.  Home Medications   Current Outpatient Rx  Name Route Sig Dispense Refill  . ALBUTEROL SULFATE HFA 108 (90 BASE) MCG/ACT IN AERS Inhalation Inhale 2 puffs into the lungs every 6 (six) hours as needed for wheezing. 1 Inhaler 0  . ASCORBIC ACID 500 MG PO TABS Oral Take 500 mg by mouth daily.    . CYCLOBENZAPRINE HCL 10 MG PO TABS Oral Take 1 tablet (10 mg total) by mouth 3 (three) times daily as needed. 30 tablet 2  . FLAXSEED (LINSEED) PO Oral Take by  mouth daily.      Marland Kitchen HYDROCODONE-ACETAMINOPHEN 7.5-325 MG PO TABS Oral Take 1 tablet by mouth every 6 (six) hours as needed for pain. 20 tablet 0  . LISINOPRIL-HYDROCHLOROTHIAZIDE 20-25 MG PO TABS Oral Take 1 tablet by mouth daily. 30 tablet 2  . MULTIVITAMIN PO Oral Take by mouth daily.      Marland Kitchen SIMVASTATIN 40 MG PO TABS Oral Take 1 tablet (40 mg total) by mouth every evening. 30 tablet 2  . TRAZODONE HCL 50 MG PO TABS Oral Take 1 tablet (50 mg total) by mouth at bedtime. 30 tablet 2  . VITAMIN B-12 500 MCG PO TABS Oral Take 500 mcg by mouth daily.      BP 127/74  Pulse 100  Temp(Src) 98.1 F (36.7 C) (Oral)  Resp 18  SpO2 98%  Physical Exam  Nursing note and vitals reviewed. Constitutional: She is oriented to person, place, and time. She appears well-developed and well-nourished.  Musculoskeletal: She exhibits tenderness.       Feet:  Neurological: She is alert and oriented to person, place, and time.    ED Course  Procedures (including critical care time)  Labs Reviewed -  No data to display Dg Toe 5th Right  02/23/2012  *RADIOLOGY REPORT*  Clinical Data: Injured right fifth toe approximately 2 months ago, with persistent and increasing pain.  RIGHT FIFTH TOE 3 VIEWS 02/23/2012:  Comparison: Right foot x-rays 01/22/2012, 06/10/2009.  Findings: Mildly impacted fracture involving the head of the proximal phalanx of the fifth toe.  Associated avulsion fracture involving the medial base of the fused middle/distal phalanx.  No fractures elsewhere.  IMPRESSION: Mildly impacted fracture involving the head of the proximal phalanx fifth toe with an associated avulsion fracture involving the medial base of the fused middle/distal phalanx.  Original Report Authenticated By: Arnell Sieving, M.D.     1. Fracture of fifth toe, right, closed, initial encounter       MDM          Linna Hoff, MD 02/23/12 [id number]

## 2012-03-20 ENCOUNTER — Other Ambulatory Visit: Payer: Self-pay | Admitting: Medical

## 2012-07-22 ENCOUNTER — Encounter (HOSPITAL_COMMUNITY): Payer: Self-pay | Admitting: Emergency Medicine

## 2012-07-22 ENCOUNTER — Emergency Department (INDEPENDENT_AMBULATORY_CARE_PROVIDER_SITE_OTHER)
Admission: EM | Admit: 2012-07-22 | Discharge: 2012-07-22 | Disposition: A | Discharge status: Home / Self Care | Payer: Self-pay | Source: Home / Self Care | Attending: Emergency Medicine | Admitting: Emergency Medicine

## 2012-07-22 DIAGNOSIS — J45909 Unspecified asthma, uncomplicated: Secondary | ICD-10-CM

## 2012-07-22 DIAGNOSIS — I10 Essential (primary) hypertension: Secondary | ICD-10-CM

## 2012-07-22 DIAGNOSIS — E785 Hyperlipidemia, unspecified: Secondary | ICD-10-CM

## 2012-07-22 DIAGNOSIS — J209 Acute bronchitis, unspecified: Secondary | ICD-10-CM

## 2012-07-22 MED ORDER — SIMVASTATIN 40 MG PO TABS: 40.0000 mg | ORAL_TABLET | Freq: Every evening | ORAL | Status: DC

## 2012-07-22 MED ORDER — BECLOMETHASONE DIPROPIONATE 80 MCG/ACT IN AERS: 2.0000 | INHALATION_SPRAY | Freq: Two times a day (BID) | RESPIRATORY_TRACT | Status: DC

## 2012-07-22 MED ORDER — ALBUTEROL SULFATE HFA 108 (90 BASE) MCG/ACT IN AERS: 1.0000 | INHALATION_SPRAY | Freq: Four times a day (QID) | RESPIRATORY_TRACT | Status: DC | PRN

## 2012-07-22 MED ORDER — PREDNISONE 10 MG PO TABS: ORAL_TABLET | ORAL | Status: DC

## 2012-07-22 MED ORDER — CYCLOBENZAPRINE HCL 10 MG PO TABS: 10.0000 mg | ORAL_TABLET | Freq: Two times a day (BID) | ORAL | Status: DC | PRN

## 2012-07-22 MED ORDER — GUAIFENESIN-CODEINE 100-10 MG/5ML PO SYRP: 10.0000 mL | ORAL_SOLUTION | Freq: Four times a day (QID) | ORAL | Status: DC | PRN

## 2012-07-22 MED ORDER — LISINOPRIL-HYDROCHLOROTHIAZIDE 20-25 MG PO TABS: 1.0000 | ORAL_TABLET | Freq: Every day | ORAL | Status: DC

## 2012-07-22 NOTE — ED Notes (Signed)
Patient states she has asthma and bronchitis x 1 week

## 2012-07-22 NOTE — ED Provider Notes (Signed)
Chief Complaint  Patient presents with  . Shortness of Breath    History of Present Illness:   The patient is a 56 year old female with a history of asthma, hyperlipidemia, hypertension, and lower back pain. She presents today with a five-day history of URI symptoms plus a need for refill on multiple medications.  Her URI symptoms have been going on for 5 days with cough productive of small amounts of clear sputum, shortness of breath, chest tightness, wheezing, sore throat, aching in the chest. She denies any fever or chills. She's had no nasal congestion or rhinorrhea. She has a history of asthma since 1995. She had been on Advair and Flovent in the past for asthma control but had to stop these since [facility] closed and she was not able to get the prescriptions there. She's not taking anything for control right now and only uses albuterol as needed. She often uses up to 3 times per day.  She also has a history of mitral valve prolapse, hyperlipidemia, prediabetes, and COPD. She's been on amoxicillin for dental work. She needs refills today and simvastatin, lisinopril/HCTZ, and cyclobenzaprine. She denies any side effects from his medications. Specifically she's had no muscle pain or cramping, coughing, dizziness, or excessive drowsiness.  Review of Systems:  Other than noted above, the patient denies any of the following symptoms. Systemic:  No fever, chills, sweats, fatigue, myalgias, headache, or anorexia. Eye:  No redness, pain or drainage. ENT:  No earache, nasal congestion, rhinorrhea, sinus pressure, or sore throat. Lungs:  No cough, sputum production, wheezing, shortness of breath.  Cardiovascular:  No chest pain, palpitations, or syncope. GI:  No nausea, vomiting, abdominal pain or diarrhea. GU:  No dysuria, frequency, or hematuria. Skin:  No rash or pruritis.  PMFSH:  Past medical history, family history, social history, meds, and allergies were reviewed.   Physical Exam:    Vital signs:  BP 136/69  Pulse 94  Temp 98.1 F (36.7 C) (Oral)  Resp 20  SpO2 96% General:  Alert, in no distress. Eye:  PERRL, full EOMs.  Lids and conjunctivas were normal. ENT:  TMs and canals were normal, without erythema or inflammation.  Nasal mucosa was clear and uncongested, without drainage.  Mucous membranes were moist.  Pharynx was clear, without exudate or drainage.  There were no oral ulcerations or lesions. Neck:  Supple, no adenopathy, tenderness or mass. Thyroid was normal. Lungs:  No respiratory distress.  Lungs were clear to auscultation, without wheezes, rales or rhonchi.  Breath sounds were clear and equal bilaterally. Heart:  Regular rhythm, without gallops, murmers or rubs. Abdomen:  Soft, flat, and non-tender to palpation.  No hepatosplenomagaly or mass. Skin:  Clear, warm, and dry, without rash or lesions.  Assessment:  The primary encounter diagnosis was Acute bronchitis. Diagnoses of Asthma, DYSLIPIDEMIA, and Essential hypertension, benign were also pertinent to this visit.  Plan:   1.  The following meds were prescribed:   New Prescriptions   ALBUTEROL (PROVENTIL HFA;VENTOLIN HFA) 108 (90 BASE) MCG/ACT INHALER    Inhale 1-2 puffs into the lungs every 6 (six) hours as needed for wheezing.   BECLOMETHASONE (QVAR) 80 MCG/ACT INHALER    Inhale 2 puffs into the lungs 2 (two) times daily.   CYCLOBENZAPRINE (FLEXERIL) 10 MG TABLET    Take 1 tablet (10 mg total) by mouth 2 (two) times daily as needed for muscle spasms.   GUAIFENESIN-CODEINE (GUIATUSS AC) 100-10 MG/5ML SYRUP    Take 10 mLs by  mouth 4 (four) times daily as needed for cough.   LISINOPRIL-HYDROCHLOROTHIAZIDE (PRINZIDE,ZESTORETIC) 20-25 MG PER TABLET    Take 1 tablet by mouth daily.   PREDNISONE (DELTASONE) 10 MG TABLET    Take 4 tabs daily for 4 days, 3 tabs daily for 4 days, 2 tabs daily for 4 days, then 1 tab daily for 4 days.   SIMVASTATIN (ZOCOR) 40 MG TABLET    Take 1 tablet (40 mg total) by mouth  every evening.   2.  The patient was instructed in symptomatic care and handouts were given. 3.  The patient was told to return if becoming worse in any way, if no better in 3 or 4 days, and given some red flag symptoms that would indicate earlier return. I suggested that she try to get into her primary care physician or back into [facility] if it opens back up again. She was provided with enough refills for 3 months. I also started her on Qvar as a controller inhaler.    Reuben Likes, MD 07/22/12 1435

## 2012-10-12 ENCOUNTER — Emergency Department (HOSPITAL_COMMUNITY)
Admission: EM | Admit: 2012-10-12 | Discharge: 2012-10-12 | Disposition: A | Discharge status: Home / Self Care | Payer: Self-pay | Source: Home / Self Care | Attending: Family Medicine | Admitting: Family Medicine

## 2012-10-12 ENCOUNTER — Encounter (HOSPITAL_COMMUNITY): Payer: Self-pay

## 2012-10-12 DIAGNOSIS — K047 Periapical abscess without sinus: Secondary | ICD-10-CM

## 2012-10-12 MED ORDER — IBUPROFEN 800 MG PO TABS
ORAL_TABLET | ORAL | Status: AC
  Filled 2012-10-12: qty 1

## 2012-10-12 MED ORDER — DICLOFENAC POTASSIUM 50 MG PO TABS: 50.0000 mg | ORAL_TABLET | Freq: Three times a day (TID) | ORAL | Status: DC

## 2012-10-12 MED ORDER — CLINDAMYCIN HCL 300 MG PO CAPS: 300.0000 mg | ORAL_CAPSULE | Freq: Three times a day (TID) | ORAL | Status: DC

## 2012-10-12 NOTE — ED Notes (Signed)
Pain upper left jaw area since last PM

## 2012-10-12 NOTE — ED Provider Notes (Signed)
History     CSN: [id number]  Arrival date & time 10/12/12  1649   First MD Initiated Contact with Patient 10/12/12 1731      Chief Complaint  Patient presents with  . Facial Pain    (Consider location/radiation/quality/duration/timing/severity/associated sxs/prior treatment) Patient is a 56 y.o. female presenting with tooth pain. The history is provided by the patient.  Dental PainThe primary symptoms include mouth pain and oral lesions. Primary symptoms do not include sore throat. The symptoms began 12 to 24 hours ago. The symptoms are worsening.  Additional symptoms include: gum swelling, gum tenderness and facial swelling. Additional symptoms do not include: jaw pain, trouble swallowing and pain with swallowing.    Past Medical History  Diagnosis Date  . Asthma   . Hypertension   . Hyperlipidemia   . Scoliosis   . Insomnia   . Mitral valve prolapse     Past Surgical History  Procedure Date  . Finger surgery 56 years old    does not remember why, also has scar on side of abdomen    Family History  Problem Relation Age of Onset  . Colon cancer Paternal Uncle   . Throat cancer Maternal Aunt   . Stroke Mother   . Heart attack Father     History  Substance Use Topics  . Smoking status: Current Every Day Smoker -- 0.3 packs/day    Types: Cigarettes  . Smokeless tobacco: Not on file  . Alcohol Use: Yes     Comment: rarely    OB History    Grav Para Term Preterm Abortions TAB SAB Ect Mult Living                  Review of Systems  Constitutional: Negative.   HENT: Positive for facial swelling and dental problem. Negative for sore throat and trouble swallowing.     Allergies  Review of patient's allergies indicates no known allergies.  Home Medications   Current Outpatient Rx  Name  Route  Sig  Dispense  Refill  . ALBUTEROL SULFATE HFA 108 (90 BASE) MCG/ACT IN AERS   Inhalation   Inhale 1-2 puffs into the lungs every 6 (six) hours as needed for  wheezing.   1 Inhaler   12   . ASCORBIC ACID 500 MG PO TABS   Oral   Take 500 mg by mouth daily.         . BECLOMETHASONE DIPROPIONATE 80 MCG/ACT IN AERS   Inhalation   Inhale 2 puffs into the lungs 2 (two) times daily.   1 Inhaler   2   . CLINDAMYCIN HCL 300 MG PO CAPS   Oral   Take 1 capsule (300 mg total) by mouth 3 (three) times daily.   21 capsule   0   . CYCLOBENZAPRINE HCL 10 MG PO TABS   Oral   Take 1 tablet (10 mg total) by mouth 3 (three) times daily as needed.   30 tablet   2   . CYCLOBENZAPRINE HCL 10 MG PO TABS   Oral   Take 1 tablet (10 mg total) by mouth 2 (two) times daily as needed for muscle spasms.   20 tablet   2   . DICLOFENAC POTASSIUM 50 MG PO TABS   Oral   Take 1 tablet (50 mg total) by mouth 3 (three) times daily. For dental pain.   21 tablet   0   . FLAXSEED (LINSEED) PO   Oral   Take  by mouth daily.           . GUAIFENESIN-CODEINE 100-10 MG/5ML PO SYRP   Oral   Take 10 mLs by mouth 4 (four) times daily as needed for cough.   120 mL   0   . LISINOPRIL-HYDROCHLOROTHIAZIDE 20-25 MG PO TABS   Oral   Take 1 tablet by mouth daily.   30 tablet   2   . LISINOPRIL-HYDROCHLOROTHIAZIDE 20-25 MG PO TABS   Oral   Take 1 tablet by mouth daily.   30 tablet   2   . MULTIVITAMIN PO   Oral   Take by mouth daily.           Marland Kitchen PREDNISONE 10 MG PO TABS      Take 4 tabs daily for 4 days, 3 tabs daily for 4 days, 2 tabs daily for 4 days, then 1 tab daily for 4 days.   40 tablet   0   . SIMVASTATIN 40 MG PO TABS   Oral   Take 1 tablet (40 mg total) by mouth every evening.   30 tablet   2   . SIMVASTATIN 40 MG PO TABS   Oral   Take 1 tablet (40 mg total) by mouth every evening.   30 tablet   2   . TRAZODONE HCL 50 MG PO TABS   Oral   Take 1 tablet (50 mg total) by mouth at bedtime.   30 tablet   2   . VENTOLIN HFA 108 (90 BASE) MCG/ACT IN AERS      INHALE TWO PUFFS EVERY 6 HOURS AS NEEDED FOR WHEEZING   18 g   3   .  VITAMIN B-12 500 MCG PO TABS   Oral   Take 500 mcg by mouth daily.           There were no vitals taken for this visit.  Physical Exam  Nursing note and vitals reviewed. Constitutional: She appears well-developed and well-nourished.  HENT:  Head: Normocephalic.    Right Ear: External ear normal.  Left Ear: External ear normal.  Mouth/Throat: Dental abscesses and dental caries present.      ED Course  Procedures (including critical care time)  Labs Reviewed - No data to display No results found.   1. Dental abscess       MDM          Linna Hoff, MD 10/12/12 [phone]

## 2012-10-19 ENCOUNTER — Encounter (HOSPITAL_COMMUNITY): Payer: Self-pay | Admitting: *Deleted

## 2012-10-19 ENCOUNTER — Other Ambulatory Visit: Payer: Self-pay

## 2012-10-19 ENCOUNTER — Emergency Department (HOSPITAL_COMMUNITY): Payer: No Typology Code available for payment source

## 2012-10-19 ENCOUNTER — Emergency Department (HOSPITAL_COMMUNITY)
Admission: EM | Admit: 2012-10-19 | Discharge: 2012-10-19 | Disposition: A | Discharge status: Home / Self Care | Payer: No Typology Code available for payment source | Attending: Emergency Medicine | Admitting: Emergency Medicine

## 2012-10-19 ENCOUNTER — Emergency Department (HOSPITAL_COMMUNITY): Admission: EM | Admit: 2012-10-19 | Discharge: 2012-10-19 | Discharge status: Absent without leave | Payer: Self-pay

## 2012-10-19 DIAGNOSIS — E785 Hyperlipidemia, unspecified: Secondary | ICD-10-CM | POA: Insufficient documentation

## 2012-10-19 DIAGNOSIS — Z79899 Other long term (current) drug therapy: Secondary | ICD-10-CM | POA: Insufficient documentation

## 2012-10-19 DIAGNOSIS — I10 Essential (primary) hypertension: Secondary | ICD-10-CM | POA: Insufficient documentation

## 2012-10-19 DIAGNOSIS — K2100 Gastro-esophageal reflux disease with esophagitis, without bleeding: Secondary | ICD-10-CM | POA: Insufficient documentation

## 2012-10-19 DIAGNOSIS — M4120 Other idiopathic scoliosis, site unspecified: Secondary | ICD-10-CM | POA: Insufficient documentation

## 2012-10-19 DIAGNOSIS — F17200 Nicotine dependence, unspecified, uncomplicated: Secondary | ICD-10-CM | POA: Insufficient documentation

## 2012-10-19 DIAGNOSIS — Z8679 Personal history of other diseases of the circulatory system: Secondary | ICD-10-CM | POA: Insufficient documentation

## 2012-10-19 DIAGNOSIS — J45909 Unspecified asthma, uncomplicated: Secondary | ICD-10-CM | POA: Insufficient documentation

## 2012-10-19 LAB — CBC WITH DIFFERENTIAL/PLATELET
Basophils Absolute: 0 10*3/uL (ref 0.0–0.1)
Basophils Relative: 0 % (ref 0–1)
Eosinophils Relative: 2 % (ref 0–5)
HCT: 38.2 % (ref 36.0–46.0)
MCH: 31.2 pg (ref 26.0–34.0)
MCHC: 34 g/dL (ref 30.0–36.0)
MCV: 91.6 fL (ref 78.0–100.0)
Monocytes Absolute: 0.6 10*3/uL (ref 0.1–1.0)
RDW: 13.8 % (ref 11.5–15.5)

## 2012-10-19 LAB — COMPREHENSIVE METABOLIC PANEL
AST: 27 U/L (ref 0–37)
Albumin: 3.8 g/dL (ref 3.5–5.2)
CO2: 32 mEq/L (ref 19–32)
Calcium: 9.9 mg/dL (ref 8.4–10.5)
Creatinine, Ser: 0.61 mg/dL (ref 0.50–1.10)
GFR calc non Af Amer: >90 mL/min (ref >90)

## 2012-10-19 LAB — POCT I-STAT TROPONIN I

## 2012-10-19 MED ORDER — HYDROCODONE-ACETAMINOPHEN 5-325 MG PO TABS: 2.0000 | ORAL_TABLET | ORAL | Status: DC | PRN

## 2012-10-19 MED ORDER — OMEPRAZOLE 20 MG PO CPDR: DELAYED_RELEASE_CAPSULE | ORAL | Status: DC

## 2012-10-19 NOTE — ED Provider Notes (Signed)
History     CSN: [id number]  Arrival date & time 10/19/12  1230   First MD Initiated Contact with Patient 10/19/12 1620      Chief Complaint  Patient presents with  . Abdominal Pain     HPI Patient presents with two-day history of epigastric discomfort and burning.  Patient has had feelings of food hanging up in her esophagus over the last few months.  History of reflux in the past.  No fever or chills.  No nausea vomiting.  Denies chest pain arm pain. Past Medical History  Diagnosis Date  . Asthma   . Hypertension   . Hyperlipidemia   . Scoliosis   . Insomnia   . Mitral valve prolapse     Past Surgical History  Procedure Date  . Finger surgery 56 years old    does not remember why, also has scar on side of abdomen    Family History  Problem Relation Age of Onset  . Colon cancer Paternal Uncle   . Throat cancer Maternal Aunt   . Stroke Mother   . Heart attack Father     History  Substance Use Topics  . Smoking status: Current Every Day Smoker -- 0.3 packs/day    Types: Cigarettes  . Smokeless tobacco: Not on file  . Alcohol Use: Yes     Comment: rarely    OB History    Grav Para Term Preterm Abortions TAB SAB Ect Mult Living                  Review of Systems All other systems reviewed and are negative Allergies  Review of patient's allergies indicates no known allergies.  Home Medications   Current Outpatient Rx  Name  Route  Sig  Dispense  Refill  . ALBUTEROL SULFATE HFA 108 (90 BASE) MCG/ACT IN AERS   Inhalation   Inhale 1-2 puffs into the lungs every 6 (six) hours as needed for wheezing.   1 Inhaler   12   . ASCORBIC ACID 500 MG PO TABS   Oral   Take 500 mg by mouth daily.         . BECLOMETHASONE DIPROPIONATE 80 MCG/ACT IN AERS   Inhalation   Inhale 2 puffs into the lungs 2 (two) times daily.   1 Inhaler   2   . CLINDAMYCIN HCL 300 MG PO CAPS   Oral   Take 1 capsule (300 mg total) by mouth 3 (three) times daily.   21 capsule  0   . CYCLOBENZAPRINE HCL 10 MG PO TABS   Oral   Take 1 tablet (10 mg total) by mouth 3 (three) times daily as needed.   30 tablet   2   . CYCLOBENZAPRINE HCL 10 MG PO TABS   Oral   Take 1 tablet (10 mg total) by mouth 2 (two) times daily as needed for muscle spasms.   20 tablet   2   . DICLOFENAC POTASSIUM 50 MG PO TABS   Oral   Take 1 tablet (50 mg total) by mouth 3 (three) times daily. For dental pain.   21 tablet   0   . FLAXSEED (LINSEED) PO   Oral   Take by mouth daily.           . GUAIFENESIN-CODEINE 100-10 MG/5ML PO SYRP   Oral   Take 10 mLs by mouth 4 (four) times daily as needed for cough.   120 mL   0   .  HYDROCODONE-ACETAMINOPHEN 5-325 MG PO TABS   Oral   Take 2 tablets by mouth every 4 (four) hours as needed for pain.   10 tablet   0   . LISINOPRIL-HYDROCHLOROTHIAZIDE 20-25 MG PO TABS   Oral   Take 1 tablet by mouth daily.   30 tablet   2   . LISINOPRIL-HYDROCHLOROTHIAZIDE 20-25 MG PO TABS   Oral   Take 1 tablet by mouth daily.   30 tablet   2   . MULTIVITAMIN PO   Oral   Take by mouth daily.           Marland Kitchen OMEPRAZOLE 20 MG PO CPDR      Take 1 tablet twice a day for one week then one tablet daily   50 capsule   1   . PREDNISONE 10 MG PO TABS      Take 4 tabs daily for 4 days, 3 tabs daily for 4 days, 2 tabs daily for 4 days, then 1 tab daily for 4 days.   40 tablet   0   . SIMVASTATIN 40 MG PO TABS   Oral   Take 1 tablet (40 mg total) by mouth every evening.   30 tablet   2   . SIMVASTATIN 40 MG PO TABS   Oral   Take 1 tablet (40 mg total) by mouth every evening.   30 tablet   2   . TRAZODONE HCL 50 MG PO TABS   Oral   Take 1 tablet (50 mg total) by mouth at bedtime.   30 tablet   2   . VENTOLIN HFA 108 (90 BASE) MCG/ACT IN AERS      INHALE TWO PUFFS EVERY 6 HOURS AS NEEDED FOR WHEEZING   18 g   3   . VITAMIN B-12 500 MCG PO TABS   Oral   Take 500 mcg by mouth daily.           BP 122/76  Pulse 71  Temp  98.5 F (36.9 C) (Oral)  Resp 16  Ht 5\' 7"  (1.702 m)  Wt 158 lb (71.668 kg)  BMI 24.75 kg/m2  SpO2 97%  Physical Exam  Nursing note and vitals reviewed. Constitutional: She is oriented to person, place, and time. She appears well-developed and well-nourished. No distress.  HENT:  Head: Normocephalic and atraumatic.  Eyes: Pupils are equal, round, and reactive to light.  Neck: Normal range of motion.  Cardiovascular: Normal rate and intact distal pulses.   Pulmonary/Chest: No respiratory distress.  Abdominal: Normal appearance. She exhibits no distension. There is tenderness.         Mild tenderness to palpation as indicated  Musculoskeletal: Normal range of motion.  Neurological: She is alert and oriented to person, place, and time. No cranial nerve deficit.  Skin: Skin is warm and dry. No rash noted.  Psychiatric: She has a normal mood and affect. Her behavior is normal.    ED Course  Procedures (including critical care time)  Date: 10/19/2012  Rate: 68  Rhythm: normal sinus rhythm with first degree AV block  QRS Axis: normal  Intervals: normal  ST/T Wave abnormalities: normal  Conduction Disutrbances: Incomplete right bundle-branch block  Narrative Interpretation: Borderline EKG    Labs Reviewed  CBC WITH DIFFERENTIAL - Abnormal; Notable for the following:    WBC 11.5 (*)     All other components within normal limits  COMPREHENSIVE METABOLIC PANEL - Abnormal; Notable for the following:    Glucose,  Bld 142 (*)     All other components within normal limits  POCT I-STAT TROPONIN I   Dg Chest 2 View  10/19/2012  *RADIOLOGY REPORT*  Clinical Data: Chest pain  CHEST - 2 VIEW  Comparison: 11/11/2010  Findings: The lungs are clear without focal infiltrate, edema, pneumothorax or pleural effusion. Interstitial markings are diffusely coarsened with chronic features. The cardiopericardial silhouette is within normal limits for size. Imaged bony structures of the thorax are  intact.  Convex leftward thoracic scoliosis is stable.  IMPRESSION: Stable exam.  No acute cardiopulmonary findings.   Original Report Authenticated By: Kennith Center, M.D.      1. Reflux esophagitis       MDM         Nelia Shi, MD 10/19/12 [phone]

## 2012-10-19 NOTE — ED Notes (Signed)
Patient complains of upper abd pain, under her rib cage.  Patient states her pain started on Thursday.  Patient states it feels like things are getting hung in her throat.  Patient states she is feeling sob at times

## 2012-10-19 NOTE — ED Notes (Signed)
Pt presents to department for evaluation of epigastric pain and burning sensation. States she feels like food is getting stuck in her throat. Also states frequent burping and belching. Denies pain at the time. She is alert and oriented x4. NAD.

## 2012-10-21 ENCOUNTER — Other Ambulatory Visit: Payer: Self-pay | Admitting: Medical

## 2012-10-21 ENCOUNTER — Telehealth: Payer: Self-pay | Admitting: Family Medicine

## 2012-10-21 NOTE — Telephone Encounter (Signed)
I called pt to discuss ER and urgent care visits can be taken care of here.  Left message to ret call and also sent letter.

## 2012-10-24 ENCOUNTER — Telehealth: Payer: Self-pay | Admitting: Medical

## 2012-10-24 NOTE — Telephone Encounter (Signed)
PT RETURNED CALL FROM 12/30. EXPLAINED TO HER THAT OUR OFFICE CAN HANDLE THE ISSUES THAT SHE HAS BEEN GOING TO THE ER AND URGENT CARE FOR BUT PT SD SHE HAS HAS BEEN OUT OF WORK& NO INSUR. ER&URGENT CARE OFFERS DISCOUNT OR FREE SERVICES THRU [employer]. PT IS BACK AT WORK NOW AND WILL BE GETTING INSURANCE SOON AND WILL BE COMING BACK HERE AS SOON AS INSURANCE STARTS. SHE STATES SHE JUST DID NOT HAVE MONEY FOR APPOINTMENT OR WAS SICK AFTER HOURS.

## 2012-10-25 ENCOUNTER — Emergency Department (HOSPITAL_COMMUNITY)
Admission: EM | Admit: 2012-10-25 | Discharge: 2012-10-25 | Disposition: A | Discharge status: Home / Self Care | Payer: No Typology Code available for payment source | Source: Home / Self Care

## 2012-10-25 ENCOUNTER — Encounter (HOSPITAL_COMMUNITY): Payer: Self-pay

## 2012-10-25 DIAGNOSIS — M5137 Other intervertebral disc degeneration, lumbosacral region: Secondary | ICD-10-CM

## 2012-10-25 DIAGNOSIS — M549 Dorsalgia, unspecified: Secondary | ICD-10-CM

## 2012-10-25 DIAGNOSIS — I10 Essential (primary) hypertension: Secondary | ICD-10-CM

## 2012-10-25 MED ORDER — HYDROCODONE-ACETAMINOPHEN 5-325 MG PO TABS: 1.0000 | ORAL_TABLET | ORAL | Status: DC | PRN

## 2012-10-25 MED ORDER — KETOROLAC TROMETHAMINE 60 MG/2ML IM SOLN
60.0000 mg | Freq: Once | INTRAMUSCULAR | Status: AC
  Administered 2012-10-25: 60 mg via INTRAMUSCULAR

## 2012-10-25 MED ORDER — PREDNISONE 20 MG PO TABS: ORAL_TABLET | ORAL | Status: DC

## 2012-10-25 MED ORDER — CYCLOBENZAPRINE HCL 5 MG PO TABS: 5.0000 mg | ORAL_TABLET | Freq: Three times a day (TID) | ORAL | Status: DC | PRN

## 2012-10-25 MED ORDER — KETOROLAC TROMETHAMINE 60 MG/2ML IM SOLN
INTRAMUSCULAR | Status: AC
  Filled 2012-10-25: qty 2

## 2012-10-25 NOTE — ED Notes (Signed)
Complain of pain in left hip for 3 days- denies any injury

## 2012-10-25 NOTE — ED Provider Notes (Signed)
History     CSN: [id number]  Arrival date & time 10/25/12  1212  Chief Complaint  Patient presents with  . Hip Pain   HPI Pt says that she started left hip pain 2 days ago, woke up with left hip pain, pt slept on right side, pt says that she is having pain with movement, occasionally pain shoots down left leg, pt has some spasms in left leg, pt has had this episodes in the past for this, pt says that once every 3-4 months, Pt had been taking ibuprofen daily,  Pt has a history of bulging discs in the lumbar spine, no loss of bowel or bladder control , pt is having significant pain and discomfort.  She says that the muscle relaxers help.  She does a lot of lifting for her job.  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  She does a lot of twisting and bending for her job.   Past Medical History  Diagnosis Date  . Asthma   . Hypertension   . Hyperlipidemia   . Scoliosis   . Insomnia   . Mitral valve prolapse     Past Surgical History  Procedure Date  . Finger surgery 57 years old    does not remember why, also has scar on side of abdomen    Family History  Problem Relation Age of Onset  . Colon cancer Paternal Uncle   . Throat cancer Maternal Aunt   . Stroke Mother   . Heart attack Father     History  Substance Use Topics  . Smoking status: Current Every Day Smoker -- 0.3 packs/day    Types: Cigarettes  . Smokeless tobacco: Not on file  . Alcohol Use: Yes     Comment: rarely    OB History    Grav Para Term Preterm Abortions TAB SAB Ect Mult Living                 Review of Systems  Constitutional: Negative.   HENT: Negative.   Eyes: Negative.   Respiratory: Negative.   Cardiovascular: Negative.   Gastrointestinal: Negative.   Musculoskeletal: Positive for back pain.  Neurological: Negative.   Hematological: Negative.   Psychiatric/Behavioral: Negative.     Allergies  Review of patient's allergies indicates no known  allergies.  Home Medications   Current Outpatient Rx  Name  Route  Sig  Dispense  Refill  . ALBUTEROL SULFATE HFA 108 (90 BASE) MCG/ACT IN AERS   Inhalation   Inhale 1-2 puffs into the lungs every 6 (six) hours as needed for wheezing.   1 Inhaler   12   . ASCORBIC ACID 500 MG PO TABS   Oral   Take 500 mg by mouth daily.         . BECLOMETHASONE DIPROPIONATE 80 MCG/ACT IN AERS   Inhalation   Inhale 2 puffs into the lungs 2 (two) times daily.   1 Inhaler   2   . CLINDAMYCIN HCL 300 MG PO CAPS   Oral   Take 1 capsule (300 mg total) by mouth 3 (three) times daily.   21 capsule   0   . CYCLOBENZAPRINE HCL 10 MG PO TABS   Oral   Take 1 tablet (10 mg total) by mouth 3 (three) times daily as needed.   30 tablet   2   . CYCLOBENZAPRINE HCL 10 MG PO TABS   Oral   Take 1  tablet (10 mg total) by mouth 2 (two) times daily as needed for muscle spasms.   20 tablet   2   . DICLOFENAC POTASSIUM 50 MG PO TABS   Oral   Take 1 tablet (50 mg total) by mouth 3 (three) times daily. For dental pain.   21 tablet   0   . FLAXSEED (LINSEED) PO   Oral   Take by mouth daily.           . GUAIFENESIN-CODEINE 100-10 MG/5ML PO SYRP   Oral   Take 10 mLs by mouth 4 (four) times daily as needed for cough.   120 mL   0   . HYDROCODONE-ACETAMINOPHEN 5-325 MG PO TABS   Oral   Take 2 tablets by mouth every 4 (four) hours as needed for pain.   10 tablet   0   . LISINOPRIL-HYDROCHLOROTHIAZIDE 20-25 MG PO TABS   Oral   Take 1 tablet by mouth daily.   30 tablet   2   . LISINOPRIL-HYDROCHLOROTHIAZIDE 20-25 MG PO TABS   Oral   Take 1 tablet by mouth daily.   30 tablet   2   . MULTIVITAMIN PO   Oral   Take by mouth daily.           Marland Kitchen OMEPRAZOLE 20 MG PO CPDR      Take 1 tablet twice a day for one week then one tablet daily   50 capsule   1   . PREDNISONE 10 MG PO TABS      Take 4 tabs daily for 4 days, 3 tabs daily for 4 days, 2 tabs daily for 4 days, then 1 tab daily for  4 days.   40 tablet   0   . SIMVASTATIN 40 MG PO TABS   Oral   Take 1 tablet (40 mg total) by mouth every evening.   30 tablet   2   . SIMVASTATIN 40 MG PO TABS   Oral   Take 1 tablet (40 mg total) by mouth every evening.   30 tablet   2   . TRAZODONE HCL 50 MG PO TABS   Oral   Take 1 tablet (50 mg total) by mouth at bedtime.   30 tablet   2   . VENTOLIN HFA 108 (90 BASE) MCG/ACT IN AERS      INHALE TWO PUFFS EVERY 6 HOURS AS NEEDED FOR WHEEZING   18 g   3   . VITAMIN B-12 500 MCG PO TABS   Oral   Take 500 mcg by mouth daily.          BP 119/81  Pulse 81  Temp 98.1 F (36.7 C) (Oral)  Resp 19  SpO2 100%  Physical Exam  Nursing note and vitals reviewed. Constitutional: She is oriented to person, place, and time. She appears well-developed and well-nourished.  HENT:  Head: Normocephalic and atraumatic.  Eyes: EOM are normal. Pupils are equal, round, and reactive to light.  Neck: Normal range of motion. Neck supple.  Cardiovascular: Normal rate, regular rhythm and normal heart sounds.   Pulmonary/Chest: Effort normal and breath sounds normal.  Abdominal: Soft. Bowel sounds are normal.  Musculoskeletal: Normal range of motion. She exhibits tenderness. She exhibits no edema.       Arms: Neurological: She is alert and oriented to person, place, and time.  Skin: Skin is warm and dry. No rash noted. No erythema. No pallor.  Psychiatric: She has a normal  mood and affect. Her behavior is normal. Judgment and thought content normal.    ED Course  Procedures (including critical care time)  Labs Reviewed - No data to display No results found.  No diagnosis found.  MDM  IMPRESSION  DDD back pain  Bulging discs lumbar spine  RECOMMENDATIONS / PLAN  Prednisone 60 mg po daily for 7 days vicodin for pain Flexeril 5 mg tid prn spasm No heavy lifting for at least 1 week   FOLLOW UP 3 weeks   The patient was given clear instructions to go to ER or  return to medical center if symptoms don't improve, worsen or new problems develop.  The patient verbalized understanding.  The patient was told to call to get lab results if they haven't heard anything in the next week.            Cleora Fleet, MD 10/25/12 1342

## 2012-11-08 ENCOUNTER — Encounter (HOSPITAL_COMMUNITY): Payer: Self-pay | Admitting: Emergency Medicine

## 2012-11-08 ENCOUNTER — Emergency Department (HOSPITAL_COMMUNITY)
Admission: EM | Admit: 2012-11-08 | Discharge: 2012-11-08 | Disposition: A | Discharge status: Home / Self Care | Payer: Medicaid Other | Attending: Emergency Medicine | Admitting: Emergency Medicine

## 2012-11-08 ENCOUNTER — Emergency Department (HOSPITAL_COMMUNITY): Payer: Medicaid Other

## 2012-11-08 DIAGNOSIS — F17200 Nicotine dependence, unspecified, uncomplicated: Secondary | ICD-10-CM | POA: Insufficient documentation

## 2012-11-08 DIAGNOSIS — E785 Hyperlipidemia, unspecified: Secondary | ICD-10-CM | POA: Insufficient documentation

## 2012-11-08 DIAGNOSIS — I10 Essential (primary) hypertension: Secondary | ICD-10-CM | POA: Insufficient documentation

## 2012-11-08 DIAGNOSIS — R110 Nausea: Secondary | ICD-10-CM | POA: Insufficient documentation

## 2012-11-08 DIAGNOSIS — R51 Headache: Secondary | ICD-10-CM | POA: Insufficient documentation

## 2012-11-08 DIAGNOSIS — Z79899 Other long term (current) drug therapy: Secondary | ICD-10-CM | POA: Insufficient documentation

## 2012-11-08 DIAGNOSIS — Z8669 Personal history of other diseases of the nervous system and sense organs: Secondary | ICD-10-CM | POA: Insufficient documentation

## 2012-11-08 DIAGNOSIS — J45909 Unspecified asthma, uncomplicated: Secondary | ICD-10-CM | POA: Insufficient documentation

## 2012-11-08 DIAGNOSIS — Z8679 Personal history of other diseases of the circulatory system: Secondary | ICD-10-CM | POA: Insufficient documentation

## 2012-11-08 MED ORDER — DIPHENHYDRAMINE HCL 50 MG/ML IJ SOLN
25.0000 mg | Freq: Once | INTRAMUSCULAR | Status: AC
  Administered 2012-11-08: 25 mg via INTRAVENOUS
  Filled 2012-11-08: qty 1

## 2012-11-08 MED ORDER — METOCLOPRAMIDE HCL 5 MG/ML IJ SOLN
10.0000 mg | Freq: Once | INTRAMUSCULAR | Status: AC
  Administered 2012-11-08: 10 mg via INTRAVENOUS
  Filled 2012-11-08: qty 2

## 2012-11-08 MED ORDER — MORPHINE SULFATE 4 MG/ML IJ SOLN
4.0000 mg | Freq: Once | INTRAMUSCULAR | Status: AC
  Administered 2012-11-08: 4 mg via INTRAVENOUS
  Filled 2012-11-08: qty 1

## 2012-11-08 MED ORDER — HYDROCODONE-ACETAMINOPHEN 5-325 MG PO TABS: 2.0000 | ORAL_TABLET | ORAL | Status: DC | PRN

## 2012-11-08 NOTE — ED Provider Notes (Signed)
History     CSN: [id number]  Arrival date & time 11/08/12  0021   First MD Initiated Contact with Patient 11/08/12 0126      Chief Complaint  Patient presents with  . Headache    (Consider location/radiation/quality/duration/timing/severity/associated sxs/prior treatment) Patient is a 57 y.o. female presenting with headaches. The history is provided by the patient.  Headache  This is a new problem. The current episode started yesterday. The problem occurs constantly. The problem has not changed since onset.The headache is associated with nothing. The pain is located in the bilateral region. The quality of the pain is described as sharp. The pain is at a severity of 7/10. The pain is moderate. The pain does not radiate. Associated symptoms include nausea. She has tried nothing for the symptoms.    Past Medical History  Diagnosis Date  . Asthma   . Hypertension   . Hyperlipidemia   . Scoliosis   . Insomnia   . Mitral valve prolapse     Past Surgical History  Procedure Date  . Finger surgery 57 years old    does not remember why, also has scar on side of abdomen    Family History  Problem Relation Age of Onset  . Colon cancer Paternal Uncle   . Throat cancer Maternal Aunt   . Stroke Mother   . Heart attack Father     History  Substance Use Topics  . Smoking status: Current Every Day Smoker -- 0.3 packs/day    Types: Cigarettes  . Smokeless tobacco: Not on file  . Alcohol Use: Yes     Comment: rarely    OB History    Grav Para Term Preterm Abortions TAB SAB Ect Mult Living                  Review of Systems  Gastrointestinal: Positive for nausea.  Neurological: Positive for headaches.  All other systems reviewed and are negative.    Allergies  Review of patient's allergies indicates no known allergies.  Home Medications   Current Outpatient Rx  Name  Route  Sig  Dispense  Refill  . ASCORBIC ACID 500 MG PO TABS   Oral   Take 500 mg by mouth  daily.         Marland Kitchen OMEPRAZOLE 20 MG PO CPDR      Take 1 tablet twice a day for one week then one tablet daily   50 capsule   1   . SIMVASTATIN 40 MG PO TABS   Oral   Take 1 tablet (40 mg total) by mouth every evening.   30 tablet   2     BP 127/75  Pulse 94  Temp 98.2 F (36.8 C) (Oral)  Resp 16  SpO2 100%  Physical Exam  Constitutional: She is oriented to person, place, and time. She appears well-developed and well-nourished.  HENT:  Head: Normocephalic and atraumatic.  Eyes: Conjunctivae normal and EOM are normal. Pupils are equal, round, and reactive to light.  Neck: Normal range of motion.  Cardiovascular: Normal rate, regular rhythm and normal heart sounds.   Pulmonary/Chest: Effort normal and breath sounds normal.  Abdominal: Soft. Bowel sounds are normal.  Musculoskeletal: Normal range of motion.  Neurological: She is alert and oriented to person, place, and time.  Skin: Skin is warm and dry.  Psychiatric: She has a normal mood and affect. Her behavior is normal.    ED Course  Procedures (including critical care time)  Labs Reviewed - No data to display No results found.   No diagnosis found.    MDM  + new atypical ha associated with nausea and vomting yesterday.  Tol po.  Will analgesia,  Ct head,  reassess        Rosanne Ashing, MD 11/08/12 [phone]

## 2012-11-08 NOTE — ED Notes (Signed)
Pt c/o headache, nausea with vomiting yesterday.  No diarrhea

## 2012-11-08 NOTE — ED Notes (Signed)
Pt dc to home.   Pt states understanding to dc instructions.  Pt ambulatory to exit without difficulty.  Pt denies need for w/c. 

## 2012-11-08 NOTE — ED Notes (Signed)
Pt ambulated with assistance to br without difficulty.

## 2012-11-08 NOTE — ED Notes (Signed)
Pt is also c/o of hip pain.  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

## 2012-11-11 ENCOUNTER — Encounter (HOSPITAL_COMMUNITY): Payer: Self-pay

## 2012-11-11 ENCOUNTER — Emergency Department (HOSPITAL_COMMUNITY)
Admission: EM | Admit: 2012-11-11 | Discharge: 2012-11-11 | Disposition: A | Discharge status: Home / Self Care | Payer: Self-pay | Source: Home / Self Care

## 2012-11-11 DIAGNOSIS — M25559 Pain in unspecified hip: Secondary | ICD-10-CM

## 2012-11-11 DIAGNOSIS — G8929 Other chronic pain: Secondary | ICD-10-CM

## 2012-11-11 DIAGNOSIS — E785 Hyperlipidemia, unspecified: Secondary | ICD-10-CM

## 2012-11-11 DIAGNOSIS — M5430 Sciatica, unspecified side: Secondary | ICD-10-CM

## 2012-11-11 DIAGNOSIS — I10 Essential (primary) hypertension: Secondary | ICD-10-CM

## 2012-11-11 MED ORDER — CYCLOBENZAPRINE HCL 5 MG PO TABS: 5.0000 mg | ORAL_TABLET | Freq: Three times a day (TID) | ORAL | Status: DC | PRN

## 2012-11-11 MED ORDER — RANITIDINE HCL 150 MG PO TABS: 150.0000 mg | ORAL_TABLET | Freq: Two times a day (BID) | ORAL | Status: AC

## 2012-11-11 MED ORDER — HYDROCODONE-ACETAMINOPHEN 10-325 MG PO TABS: 1.0000 | ORAL_TABLET | Freq: Three times a day (TID) | ORAL | Status: DC | PRN

## 2012-11-11 NOTE — ED Notes (Signed)
Patient states still having pain in left hip.States it has gotten worse, also has headache and heartburn

## 2012-11-11 NOTE — ED Provider Notes (Signed)
History    CSN: [id number]  Arrival date & time 11/11/12  1523  Chief Complaint  Patient presents with  . Hip Pain   HPI Pt is having left hip pain that persists.  Pt reports that this has been an ongoing problem.  Patient says she had initially experienced some improvement in symptoms but then when she started working again she developed on worsening symptoms in the left hip similar to symptoms she had recently.  Patient says she like to have a few more days off work at this time.  She's been resting at home.  She's been taking the pain medication in the muscle relaxers.  She reports that she still having some difficulty sleeping.  She denies constipation.  Past Medical History  Diagnosis Date  . Asthma   . Hypertension   . Hyperlipidemia   . Scoliosis   . Insomnia   . Mitral valve prolapse     Past Surgical History  Procedure Date  . Finger surgery 57 years old    does not remember why, also has scar on side of abdomen    Family History  Problem Relation Age of Onset  . Colon cancer Paternal Uncle   . Throat cancer Maternal Aunt   . Stroke Mother   . Heart attack Father     History  Substance Use Topics  . Smoking status: Current Every Day Smoker -- 0.3 packs/day    Types: Cigarettes  . Smokeless tobacco: Not on file  . Alcohol Use: Yes     Comment: rarely    OB History    Grav Para Term Preterm Abortions TAB SAB Ect Mult Living                 Review of Systems  Musculoskeletal: Positive for back pain and arthralgias.       Left hip pain and sciatica involving the left leg  Neurological: Negative.   Hematological: Negative.   Psychiatric/Behavioral: Negative.   All other systems reviewed and are negative.    Allergies  Review of patient's allergies indicates no known allergies.  Home Medications   Current Outpatient Rx  Name  Route  Sig  Dispense  Refill  . ASCORBIC ACID 500 MG PO TABS   Oral   Take 500 mg by mouth daily.         Marland Kitchen  HYDROCODONE-ACETAMINOPHEN 5-325 MG PO TABS   Oral   Take 2 tablets by mouth every 4 (four) hours as needed for pain.   10 tablet   0   . OMEPRAZOLE 20 MG PO CPDR      Take 1 tablet twice a day for one week then one tablet daily   50 capsule   1   . SIMVASTATIN 40 MG PO TABS   Oral   Take 1 tablet (40 mg total) by mouth every evening.   30 tablet   2    BP 126/81  Pulse 109  Temp 98.8 F (37.1 C) (Oral)  Resp 19  SpO2 100%  Physical Exam  Nursing note and vitals reviewed. Constitutional: She is oriented to person, place, and time. She appears well-developed and well-nourished. She appears distressed.  HENT:  Head: Normocephalic and atraumatic.  Eyes: EOM are normal. Pupils are equal, round, and reactive to light.  Neck: Normal range of motion. Neck supple.  Cardiovascular: Normal rate, regular rhythm and normal heart sounds.   Pulmonary/Chest: Effort normal and breath sounds normal.  Abdominal: Soft. Bowel sounds are  normal.  Musculoskeletal: She exhibits tenderness.       Tenderness of the left side, full range of motion, negative straight leg raise  Neurological: She is alert and oriented to person, place, and time. She has normal reflexes. No cranial nerve deficit. Coordination normal.  Skin: Skin is warm and dry. No erythema.  Psychiatric: She has a normal mood and affect. Her behavior is normal. Judgment and thought content normal.    ED Course  Procedures (including critical care time)  Labs Reviewed - No data to display No results found.  No diagnosis found.  MDM  IMPRESSION  LBP with left sciatica   Muscle Spasm  Hypertension  Headache   RECOMMENDATIONS / PLAN Pt sent for left hip xrays Refilled Vicodin (increased dose to 10 mg) Refilled cyclobenzaprine 5 mg po tid Orthopedics referral when pt get the orange card established.    FOLLOW UP 3 months   The patient was given clear instructions to go to ER or return to medical center if  symptoms don't improve, worsen or new problems develop.  The patient verbalized understanding.  The patient was told to call to get lab results if they haven't heard anything in the next week.            Cleora Fleet, MD 11/11/12 1920

## 2012-11-13 NOTE — ED Notes (Signed)
Tried several times to call patient about her ortho referral. There was no answer and no machine.financial application was mailed so she can go to [facility]

## 2012-11-18 ENCOUNTER — Ambulatory Visit (HOSPITAL_COMMUNITY)
Admission: RE | Admit: 2012-11-18 | Discharge: 2012-11-18 | Disposition: A | Discharge status: Home / Self Care | Payer: Medicaid Other | Source: Ambulatory Visit | Attending: Family Medicine | Admitting: Family Medicine

## 2012-11-18 DIAGNOSIS — M25559 Pain in unspecified hip: Secondary | ICD-10-CM | POA: Insufficient documentation

## 2012-12-02 ENCOUNTER — Telehealth (HOSPITAL_COMMUNITY): Payer: Self-pay

## 2012-12-02 NOTE — ED Notes (Signed)
Patient is scheduled for an MRI 12/03/12 @ 5PM check in with radiology 1st floor

## 2012-12-02 NOTE — Progress Notes (Signed)
Pt was notified of her xray results of hip and will schedule for MR of Left hip.  Pt says she has received paperwork for financial assistance so that she can get established with orthopedics.  Pt has an orange card now.   Pt coming in to pick up copy of xray report.   Pt advised to RTC if symptoms worsen or go to ER for treatment.  Pt verbalized understanding.     Rodney Langton, MD, CDE, FAAFP [facility] [place], [place]

## 2012-12-03 ENCOUNTER — Ambulatory Visit (HOSPITAL_COMMUNITY)
Admission: RE | Admit: 2012-12-03 | Discharge: 2012-12-03 | Disposition: A | Discharge status: Home / Self Care | Payer: No Typology Code available for payment source | Source: Ambulatory Visit | Attending: Family Medicine | Admitting: Family Medicine

## 2012-12-04 ENCOUNTER — Ambulatory Visit (HOSPITAL_COMMUNITY)
Admission: RE | Admit: 2012-12-04 | Discharge: 2012-12-04 | Disposition: A | Discharge status: Home / Self Care | Payer: Medicaid Other | Source: Ambulatory Visit | Attending: Family Medicine | Admitting: Family Medicine

## 2012-12-04 DIAGNOSIS — M25559 Pain in unspecified hip: Secondary | ICD-10-CM | POA: Insufficient documentation

## 2012-12-04 DIAGNOSIS — M76899 Other specified enthesopathies of unspecified lower limb, excluding foot: Secondary | ICD-10-CM | POA: Insufficient documentation

## 2012-12-04 DIAGNOSIS — M87059 Idiopathic aseptic necrosis of unspecified femur: Secondary | ICD-10-CM | POA: Insufficient documentation

## 2012-12-04 LAB — BUN: BUN: 10 mg/dL (ref 6–23)

## 2012-12-04 MED ORDER — GADOBENATE DIMEGLUMINE 529 MG/ML IV SOLN
15.0000 mL | Freq: Once | INTRAVENOUS | Status: AC
  Administered 2012-12-04: 14 mL via INTRAVENOUS

## 2012-12-06 ENCOUNTER — Telehealth (HOSPITAL_COMMUNITY): Payer: Self-pay

## 2012-12-06 MED ORDER — HYDROCODONE-ACETAMINOPHEN 10-325 MG PO TABS: 1.0000 | ORAL_TABLET | Freq: Three times a day (TID) | ORAL | Status: DC | PRN

## 2012-12-09 ENCOUNTER — Telehealth (HOSPITAL_COMMUNITY): Payer: Self-pay

## 2012-12-09 NOTE — Telephone Encounter (Signed)
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  Please call and make her an appointment with an orthopedist ASAP for an appointment in next week.  Important.            Rodney Langton, MD, CDE, FAAFP      [facility]      [facility]      [place], [place]        ------

## 2012-12-23 ENCOUNTER — Emergency Department (HOSPITAL_COMMUNITY)
Admission: EM | Admit: 2012-12-23 | Discharge: 2012-12-23 | Disposition: A | Discharge status: Home / Self Care | Payer: No Typology Code available for payment source | Source: Home / Self Care | Attending: Family Medicine | Admitting: Family Medicine

## 2012-12-23 ENCOUNTER — Encounter (HOSPITAL_COMMUNITY): Payer: Self-pay

## 2012-12-23 DIAGNOSIS — M87059 Idiopathic aseptic necrosis of unspecified femur: Secondary | ICD-10-CM

## 2012-12-23 DIAGNOSIS — M87051 Idiopathic aseptic necrosis of right femur: Secondary | ICD-10-CM

## 2012-12-23 MED ORDER — KETOROLAC TROMETHAMINE 10 MG PO TABS: 10.0000 mg | ORAL_TABLET | Freq: Four times a day (QID) | ORAL | Status: DC | PRN

## 2012-12-23 NOTE — ED Notes (Signed)
Requesting pain med post imaging studies; unable to get appt w [facility] for follow up

## 2012-12-23 NOTE — ED Provider Notes (Signed)
History     CSN: [id number]  Arrival date & time 12/23/12  1343   First MD Initiated Contact with Patient 12/23/12 1417      Chief Complaint  Patient presents with  . Hip Pain    (Consider location/radiation/quality/duration/timing/severity/associated sxs/prior treatment) Patient is a 57 y.o. female presenting with hip pain.  Hip Pain This is a chronic problem. The current episode started more than 1 week ago (2-3 mos hx , 2/12 2014 had hip mri. bilat avn of hips.).    Past Medical History  Diagnosis Date  . Asthma   . Hypertension   . Hyperlipidemia   . Scoliosis   . Insomnia   . Mitral valve prolapse     Past Surgical History  Procedure Laterality Date  . Finger surgery  57 years old    does not remember why, also has scar on side of abdomen    Family History  Problem Relation Age of Onset  . Colon cancer Paternal Uncle   . Throat cancer Maternal Aunt   . Stroke Mother   . Heart attack Father     History  Substance Use Topics  . Smoking status: Current Every Day Smoker -- 0.30 packs/day    Types: Cigarettes  . Smokeless tobacco: Not on file  . Alcohol Use: Yes     Comment: rarely    OB History   Grav Para Term Preterm Abortions TAB SAB Ect Mult Living                  Review of Systems  Constitutional: Negative.   Musculoskeletal: Positive for gait problem.    Allergies  Review of patient's allergies indicates no known allergies.  Home Medications   Current Outpatient Rx  Name  Route  Sig  Dispense  Refill  . ascorbic acid (VITAMIN C) 500 MG tablet   Oral   Take 500 mg by mouth daily.         . cyclobenzaprine (FLEXERIL) 5 MG tablet   Oral   Take 1 tablet (5 mg total) by mouth 3 (three) times daily as needed for muscle spasms.   30 tablet   0   . HYDROcodone-acetaminophen (NORCO) 10-325 MG per tablet   Oral   Take 1 tablet by mouth every 8 (eight) hours as needed for pain.   30 tablet   0   . ketorolac (TORADOL) 10 MG tablet  Oral   Take 1 tablet (10 mg total) by mouth every 6 (six) hours as needed for pain.   30 tablet   0   . omeprazole (PRILOSEC) 20 MG capsule      Take 1 tablet twice a day for one week then one tablet daily   50 capsule   1   . ranitidine (ZANTAC) 150 MG tablet   Oral   Take 1 tablet (150 mg total) by mouth 2 (two) times daily.   60 tablet   2   . simvastatin (ZOCOR) 40 MG tablet   Oral   Take 1 tablet (40 mg total) by mouth every evening.   30 tablet   2     BP 152/84  Pulse 96  Temp(Src) 97 F (36.1 C) (Oral)  Resp 16  Physical Exam  Nursing note and vitals reviewed. Constitutional: She is oriented to person, place, and time. She appears well-developed and well-nourished.  Abdominal: Soft. Bowel sounds are normal.  Musculoskeletal: She exhibits tenderness.       Right hip: She  exhibits decreased strength and tenderness.       Left hip: She exhibits decreased range of motion, decreased strength and tenderness.  Neurological: She is alert and oriented to person, place, and time.  Skin: Skin is warm and dry.    ED Course  Procedures (including critical care time)  Labs Reviewed - No data to display No results found.   1. Avascular necrosis of bones of both hips       MDM          Linna Hoff, MD 12/23/12 [phone]

## 2012-12-24 ENCOUNTER — Encounter: Payer: Self-pay | Admitting: Family Medicine

## 2012-12-24 DIAGNOSIS — M87052 Idiopathic aseptic necrosis of left femur: Secondary | ICD-10-CM

## 2012-12-24 DIAGNOSIS — M87051 Idiopathic aseptic necrosis of right femur: Secondary | ICD-10-CM | POA: Insufficient documentation

## 2012-12-24 NOTE — ED Notes (Signed)
Pt has appt 3/19 @ 1:30 [facility] pt is aware

## 2012-12-25 ENCOUNTER — Other Ambulatory Visit: Payer: Self-pay | Admitting: Internal Medicine

## 2012-12-25 NOTE — Telephone Encounter (Signed)
Not IM/OPC pt.

## 2012-12-30 ENCOUNTER — Encounter (HOSPITAL_COMMUNITY): Payer: Self-pay | Admitting: *Deleted

## 2012-12-30 ENCOUNTER — Emergency Department (HOSPITAL_COMMUNITY)
Admission: EM | Admit: 2012-12-30 | Discharge: 2012-12-30 | Disposition: A | Discharge status: Home / Self Care | Payer: No Typology Code available for payment source | Source: Home / Self Care

## 2012-12-30 DIAGNOSIS — M87059 Idiopathic aseptic necrosis of unspecified femur: Secondary | ICD-10-CM

## 2012-12-30 DIAGNOSIS — G4700 Insomnia, unspecified: Secondary | ICD-10-CM

## 2012-12-30 DIAGNOSIS — K219 Gastro-esophageal reflux disease without esophagitis: Secondary | ICD-10-CM

## 2012-12-30 DIAGNOSIS — M25559 Pain in unspecified hip: Secondary | ICD-10-CM

## 2012-12-30 LAB — COMPREHENSIVE METABOLIC PANEL
ALT: 13 U/L (ref 0–35)
AST: 17 U/L (ref 0–37)
Alkaline Phosphatase: 89 U/L (ref 39–117)
CO2: 29 mEq/L (ref 19–32)
Chloride: 104 mEq/L (ref 96–112)
Creatinine, Ser: 0.59 mg/dL (ref 0.50–1.10)
GFR calc non Af Amer: >90 mL/min (ref >90)
Sodium: 141 mEq/L (ref 135–145)
Total Bilirubin: 0.3 mg/dL (ref 0.3–1.2)

## 2012-12-30 LAB — CBC
HCT: 38.9 % (ref 36.0–46.0)
Hemoglobin: 13.9 g/dL (ref 12.0–15.0)
RBC: 4.37 MIL/uL (ref 3.87–5.11)
WBC: 8.7 10*3/uL (ref 4.0–10.5)

## 2012-12-30 LAB — LIPID PANEL: LDL Cholesterol: 120 mg/dL — ABNORMAL HIGH (ref 0–99)

## 2012-12-30 MED ORDER — ALBUTEROL SULFATE HFA 108 (90 BASE) MCG/ACT IN AERS: 2.0000 | INHALATION_SPRAY | Freq: Four times a day (QID) | RESPIRATORY_TRACT | Status: DC | PRN

## 2012-12-30 MED ORDER — TRAZODONE HCL 100 MG PO TABS: 100.0000 mg | ORAL_TABLET | Freq: Every day | ORAL | Status: DC

## 2012-12-30 MED ORDER — HYDROCODONE-ACETAMINOPHEN 10-325 MG PO TABS: 1.0000 | ORAL_TABLET | Freq: Three times a day (TID) | ORAL | Status: DC | PRN

## 2012-12-30 MED ORDER — CALCIUM-VITAMIN D 500-200 MG-UNIT PO TABS: 1.0000 | ORAL_TABLET | Freq: Every day | ORAL | Status: AC

## 2012-12-30 MED ORDER — KETOROLAC TROMETHAMINE 10 MG PO TABS: 10.0000 mg | ORAL_TABLET | Freq: Four times a day (QID) | ORAL | Status: DC | PRN

## 2012-12-30 NOTE — ED Notes (Signed)
Bone density scan appt is 02/06/2013  @ [facility] [address] @ 9:15 am to arrive at 9 am- patient is aware of appt

## 2012-12-30 NOTE — ED Notes (Signed)
Patient presents for medication refill. Also, states has pain along left hip.

## 2012-12-30 NOTE — ED Provider Notes (Addendum)
History     CSN: [id number]  Arrival date & time 12/30/12  1023   None     Chief Complaint  Patient presents with  . Medication Refill    (Consider location/radiation/quality/duration/timing/severity/associated sxs/prior treatment) HPI  Mrs. Schwering reports a several history of left hip pain secondary to avascular necrosis.  She reports no known family history of osteoporosis.  She has had not had a DEXA scan.  Ibuprofen has helped the pain to some extent, but exacerbates reflux type symptoms.  Vicodin temporarily helps the pain.  She has been taking this with the muscle relaxer at times.  The pain occasionally awakens her at night.  Pain is associated with a pulling sensation in the groin.    Past Medical History  Diagnosis Date  . Asthma   . Hypertension   . Hyperlipidemia   . Scoliosis   . Insomnia   . Mitral valve prolapse     Past Surgical History  Procedure Laterality Date  . Finger surgery  57 years old    does not remember why, also has scar on side of abdomen    Family History  Problem Relation Age of Onset  . Colon cancer Paternal Uncle   . Throat cancer Maternal Aunt   . Stroke Mother   . Heart attack Father     History  Substance Use Topics  . Smoking status: Current Every Day Smoker -- 0.30 packs/day    Types: Cigarettes  . Smokeless tobacco: Not on file  . Alcohol Use: Yes     Comment: rarely    OB History   Grav Para Term Preterm Abortions TAB SAB Ect Mult Living                  Review of Systems No F/C, no weight changes.  Occasional abdominal pain, associated with reflux type symptoms.  No CP, cough, SOB.  All other systems reviewed and are negative.  Allergies  Review of patient's allergies indicates no known allergies.  Home Medications   Current Outpatient Rx  Name  Route  Sig  Dispense  Refill  . ascorbic acid (VITAMIN C) 500 MG tablet   Oral   Take 500 mg by mouth daily.         . cyclobenzaprine (FLEXERIL) 5 MG tablet  Oral   Take 1 tablet (5 mg total) by mouth 3 (three) times daily as needed for muscle spasms.   30 tablet   0   . HYDROcodone-acetaminophen (NORCO) 10-325 MG per tablet   Oral   Take 1 tablet by mouth every 8 (eight) hours as needed for pain.   30 tablet   0   . ketorolac (TORADOL) 10 MG tablet   Oral   Take 1 tablet (10 mg total) by mouth every 6 (six) hours as needed for pain.   30 tablet   0   . omeprazole (PRILOSEC) 20 MG capsule      Take 1 tablet twice a day for one week then one tablet daily   50 capsule   1   . ranitidine (ZANTAC) 150 MG tablet   Oral   Take 1 tablet (150 mg total) by mouth 2 (two) times daily.   60 tablet   2   . simvastatin (ZOCOR) 40 MG tablet   Oral   Take 1 tablet (40 mg total) by mouth every evening.   30 tablet   2     BP 147/72  Pulse 89  Temp(Src)  98.4 F (36.9 C) (Oral)  Resp 16  SpO2 100%  Physical Exam General: No acute distress. HEENT: Normocephalic, atraumatic. Eyes: Arcus senilis noted bilaterally. Mouth: Oropharynx clear with moist mucous membranes. No tonsillar exudates. Neck: Supple, no thyromegaly, no lymphadenopathy, no jugular venous distention. Chest: Lungs clear to auscultation bilaterally with good air movement. Heart: Regular rate, and rhythm. No murmurs, rubs, or gallops. Abdomen: Soft, nontender, nondistended with normal active bowel sounds. Extremities: No clubbing, edema, or cyanosis. Musculoskeletal: Some pain with range of motion exercises at the hips. Neurological: Weak patellar reflex on the left. Psychiatric: Mood and affect appear normal.   ED Course  Procedures (including critical care time)  Labs Reviewed - No data to display No results found.   No diagnosis found.    MDM  1. Osteonecrosis of both hips: Patient does not have any significant risk factors for this so would check a DEXA scan to rule out underlying osteoporosis. If so, she may benefit from bowling building therapy. Would  continue her calcium/vitamin D supplement which has been refilled. Would continue her Vicodin and Toradol for pain control. 2. Insomnia: Patient has purchased a supply of over-the-counter melatonin. She has taken trazodone in the past. I have refilled the trazodone with instructions on how to take this medication. 3. Asthma: The patient's albuterol inhaler was refilled. 4. Hyperlipidemia: Patient will continue her current dose of statin therapy. 5. Reflux: The patient is on both PPI therapy and a histamine blocker. She was encouraged to take the PPI first and only use the Zantac for breakthrough symptoms. 6. Hypertension: Patient's blood pressure is currently controlled. Not currently on antihypertensives. 7. Routine health maintenance: We'll check routine lab work including a lipid profile and TSH. Patient states she has not had any blood work in the past 2 years. Also a DEXA scan will be checked.   Maryruth Bun Rama, MD 12/30/12 1123  Maryruth Bun Rama, MD 12/30/12 1353

## 2013-01-06 ENCOUNTER — Telehealth: Payer: Self-pay | Admitting: Internal Medicine

## 2013-01-06 ENCOUNTER — Ambulatory Visit (HOSPITAL_COMMUNITY)
Admission: RE | Admit: 2013-01-06 | Discharge: 2013-01-06 | Disposition: A | Discharge status: Home / Self Care | Payer: No Typology Code available for payment source | Source: Ambulatory Visit | Attending: Internal Medicine | Admitting: Internal Medicine

## 2013-01-06 DIAGNOSIS — Z13820 Encounter for screening for osteoporosis: Secondary | ICD-10-CM | POA: Insufficient documentation

## 2013-01-06 DIAGNOSIS — M8700 Idiopathic aseptic necrosis of unspecified bone: Secondary | ICD-10-CM | POA: Insufficient documentation

## 2013-01-06 NOTE — Telephone Encounter (Signed)
Faxed oved medical records to disability deteremination services @ [phone]

## 2013-01-08 ENCOUNTER — Ambulatory Visit (INDEPENDENT_AMBULATORY_CARE_PROVIDER_SITE_OTHER): Payer: No Typology Code available for payment source | Admitting: Family Medicine

## 2013-01-08 ENCOUNTER — Encounter: Payer: Self-pay | Admitting: Family Medicine

## 2013-01-08 VITALS — BP 117/75 | HR 99 | Ht 67.0 in | Wt 152.0 lb

## 2013-01-08 DIAGNOSIS — M25552 Pain in left hip: Secondary | ICD-10-CM

## 2013-01-08 DIAGNOSIS — M87059 Idiopathic aseptic necrosis of unspecified femur: Secondary | ICD-10-CM

## 2013-01-08 DIAGNOSIS — M25559 Pain in unspecified hip: Secondary | ICD-10-CM

## 2013-01-08 DIAGNOSIS — M87051 Idiopathic aseptic necrosis of right femur: Secondary | ICD-10-CM

## 2013-01-08 MED ORDER — METHYLPREDNISOLONE ACETATE 40 MG/ML IJ SUSP
40.0000 mg | Freq: Once | INTRAMUSCULAR | Status: AC
  Administered 2013-01-08: 40 mg via INTRA_ARTICULAR

## 2013-01-08 NOTE — Assessment & Plan Note (Signed)
Left hip treated with diagnostic and therapeutic injection today.  Ultimately this patient will require hip replacement however she does not have health insurance this time.  Recommend she seek health insurance. In the interim medication pain management along with intermittent ultrasound-guided femoral acetabular injections.  Followup as needed.  Discussed warning signs or symptoms. Please see discharge instructions. Patient expresses understanding.

## 2013-01-08 NOTE — Patient Instructions (Addendum)
Thank you for coming in today. You have a condition that will require a hip replacement.  We did an injection today that may help.  Try to get health insurance so that you can have a hip replacement.  Come back as needed. Call or go to the ER if you develop a large red swollen joint with extreme pain or oozing puss.

## 2013-01-08 NOTE — Progress Notes (Signed)
Annette Andrade is a 57 y.o. female who presents to [facility] today for left hip pain. Patient has had bilateral hip pain for several years however it worsened recently. 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  She's been taking prescription hydrocodone for pain which has helped. 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 She notes her pain is worse with activities of daily living and rotation of her hip. She denies any radiating pain weakness numbness fevers or chills.   PMH reviewed. Glaucoma History  Substance Use Topics  . Smoking status: Current Every Day Smoker -- 0.30 packs/day    Types: Cigarettes  . Smokeless tobacco: Not on file  . Alcohol Use: Yes     Comment: rarely   ROS as above otherwise neg   Exam:  BP 117/75  Pulse 99  Ht 5\' 7"  (1.702 m)  Wt 152 lb (68.947 kg)  BMI 23.8 kg/m2 Gen: Well NAD MSK: Left hip. Normal-appearing nontender Decreased painful range of motion to rotation and flexion.  Pulses capillary refill and sensation are intact distally.   Limited musculoskeletal ultrasound of the femoral acetabular joint.  Distended hip capsule seen. Intact bony structures.   Ultrasound-guided left hip injection.  Consent obtained and timeout performed.  Ultrasound probe placed longitudinally across the joint structures identified.  Probe position was marked .  The probe was then paced transversely across the great vessels of the groin.  These were marked.  The skin was then cleaned and sterilized as was the probe.  Sterile gel was used in the original probe position.  A 22-gauge 3 inch spinal needle was used to access the joint capsule under ultrasound guidance.  4 mL of Marcaine and 40 mg of Depo-Medrol were injected into the joint capsule.  Patient tolerated procedure well with no bleeding.    MRI OF THE LEFT HIP WITHOUT AND WITH CONTRAST  Technique: Multiplanar, multisequence MR imaging was  performed  both before and after administration of intravenous contrast.  Contrast: 14mL MULTIHANCE GADOBENATE DIMEGLUMINE 529 MG/ML IV SOLN  Comparison: Plain films 11/18/2012.  Findings: The patient has avascular necrosis of the femoral heads.  Changes are worse on the left where there is intense marrow edema  in the left femoral head and neck. No fragmentation of either  femoral head is identified and the femoral heads do not appear  flattened. Mild degenerative change is present about the left hip.  There is no hip joint effusion. Mild enhancement seen in the  trochanteric bursa bilaterally, worse on the left, compatible with  bursitis. All visualized muscles and tendons are normal in  appearance. No evidence of neoplastic process is seen. The  sacroiliac joints and symphysis pubis appear normal. Imaged  intrapelvic contents are unremarkable.  IMPRESSION:  1. Left worse than right avascular necrosis of the femoral heads.  No femoral fragmentation or collapse is identified. Intense marrow  edema in the left femoral head and neck is noted.  2. Bilateral trochanteric bursitis, worse than left.  Original Report Authenticated By: Holley Dexter, M.D.

## 2013-01-14 ENCOUNTER — Encounter (HOSPITAL_COMMUNITY): Payer: Self-pay | Admitting: Emergency Medicine

## 2013-01-14 ENCOUNTER — Emergency Department (HOSPITAL_COMMUNITY)
Admission: EM | Admit: 2013-01-14 | Discharge: 2013-01-14 | Disposition: A | Discharge status: Home / Self Care | Payer: No Typology Code available for payment source | Attending: Emergency Medicine | Admitting: Emergency Medicine

## 2013-01-14 ENCOUNTER — Emergency Department (HOSPITAL_COMMUNITY): Payer: No Typology Code available for payment source

## 2013-01-14 DIAGNOSIS — J45909 Unspecified asthma, uncomplicated: Secondary | ICD-10-CM | POA: Insufficient documentation

## 2013-01-14 DIAGNOSIS — Y9389 Activity, other specified: Secondary | ICD-10-CM | POA: Insufficient documentation

## 2013-01-14 DIAGNOSIS — F17200 Nicotine dependence, unspecified, uncomplicated: Secondary | ICD-10-CM | POA: Insufficient documentation

## 2013-01-14 DIAGNOSIS — I10 Essential (primary) hypertension: Secondary | ICD-10-CM | POA: Insufficient documentation

## 2013-01-14 DIAGNOSIS — E785 Hyperlipidemia, unspecified: Secondary | ICD-10-CM | POA: Insufficient documentation

## 2013-01-14 DIAGNOSIS — M25552 Pain in left hip: Secondary | ICD-10-CM

## 2013-01-14 DIAGNOSIS — Z79899 Other long term (current) drug therapy: Secondary | ICD-10-CM | POA: Insufficient documentation

## 2013-01-14 DIAGNOSIS — X500XXA Overexertion from strenuous movement or load, initial encounter: Secondary | ICD-10-CM | POA: Insufficient documentation

## 2013-01-14 DIAGNOSIS — Y929 Unspecified place or not applicable: Secondary | ICD-10-CM | POA: Insufficient documentation

## 2013-01-14 DIAGNOSIS — Z8739 Personal history of other diseases of the musculoskeletal system and connective tissue: Secondary | ICD-10-CM | POA: Insufficient documentation

## 2013-01-14 DIAGNOSIS — S79919A Unspecified injury of unspecified hip, initial encounter: Secondary | ICD-10-CM | POA: Insufficient documentation

## 2013-01-14 DIAGNOSIS — Z8679 Personal history of other diseases of the circulatory system: Secondary | ICD-10-CM | POA: Insufficient documentation

## 2013-01-14 MED ORDER — HYDROCODONE-ACETAMINOPHEN 5-325 MG PO TABS
2.0000 | ORAL_TABLET | Freq: Once | ORAL | Status: AC
  Administered 2013-01-14: 2 via ORAL
  Filled 2013-01-14: qty 2

## 2013-01-14 MED ORDER — METHOCARBAMOL 500 MG PO TABS: 500.0000 mg | ORAL_TABLET | Freq: Two times a day (BID) | ORAL | Status: DC

## 2013-01-14 NOTE — ED Provider Notes (Signed)
History     CSN: [id number]  Arrival date & time 01/14/13  1159   First MD Initiated Contact with Patient 01/14/13 1314      Chief Complaint  Patient presents with  . Hip Pain    (Consider location/radiation/quality/duration/timing/severity/associated sxs/prior treatment) HPI Comments: This Is a 57 year old female with chronic pain, who presents emergency department with chief complaint of left hip pain. Patient states that she twisted her hip "funny" earlier today and began having increasing pain. She is able to ambulate, however it is painful. Her symptoms do not radiate. She denies bowel or bladder incontinence. 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  The history is provided by the patient. No language interpreter was used.    Past Medical History  Diagnosis Date  . Asthma   . Hypertension   . Hyperlipidemia   . Scoliosis   . Insomnia   . Mitral valve prolapse     Past Surgical History  Procedure Laterality Date  . Finger surgery  57 years old    does not remember why, also has scar on side of abdomen    Family History  Problem Relation Age of Onset  . Colon cancer Paternal Uncle   . Throat cancer Maternal Aunt   . Stroke Mother   . Heart attack Father     History  Substance Use Topics  . Smoking status: Current Every Day Smoker -- 0.30 packs/day    Types: Cigarettes  . Smokeless tobacco: Not on file  . Alcohol Use: Yes     Comment: rarely    OB History   Grav Para Term Preterm Abortions TAB SAB Ect Mult Living                  Review of Systems  All other systems reviewed and are negative.    Allergies  Review of patient's allergies indicates no known allergies.  Home Medications   Current Outpatient Rx  Name  Route  Sig  Dispense  Refill  . albuterol (PROVENTIL HFA;VENTOLIN HFA) 108 (90 BASE) MCG/ACT inhaler   Inhalation   Inhale 2 puffs into the lungs every 6 (six) hours as  needed for wheezing.   1 Inhaler   11   . Ascorbic Acid (VITAMIN C) 1000 MG tablet   Oral   Take 1,000 mg by mouth at bedtime.          Marland Kitchen b complex vitamins tablet   Oral   Take 1 tablet by mouth daily.         . Calcium Carbonate-Vitamin D (CALCIUM-VITAMIN D) 500-200 MG-UNIT per tablet   Oral   Take 1 tablet by mouth daily.   30 tablet   11   . cyclobenzaprine (FLEXERIL) 10 MG tablet   Oral   Take 20 mg by mouth at bedtime.         . Flaxseed, Linseed, (FLAX SEEDS PO)   Oral   Take 1 capsule by mouth daily.         Marland Kitchen HYDROcodone-acetaminophen (NORCO) 10-325 MG per tablet   Oral   Take 1 tablet by mouth every 8 (eight) hours as needed for pain.         Marland Kitchen ketorolac (TORADOL) 10 MG tablet   Oral   Take 10 mg by mouth every 6 (six) hours as needed for pain.         . Melatonin 5 MG  CAPS   Oral   Take 5 mg by mouth at bedtime.         Marland Kitchen omeprazole (PRILOSEC) 20 MG capsule      Take 1 tablet twice a day for one week then one tablet daily   50 capsule   1   . ranitidine (ZANTAC) 150 MG tablet   Oral   Take 1 tablet (150 mg total) by mouth 2 (two) times daily.   60 tablet   2   . simvastatin (ZOCOR) 40 MG tablet   Oral   Take 40 mg by mouth every evening.         . methocarbamol (ROBAXIN) 500 MG tablet   Oral   Take 1 tablet (500 mg total) by mouth 2 (two) times daily.   20 tablet   0   . traZODone (DESYREL) 100 MG tablet   Oral   Take 1 tablet (100 mg total) by mouth at bedtime.   30 tablet   11     BP 136/72  Pulse 89  Temp(Src) 98.3 F (36.8 C) (Oral)  Resp 18  SpO2 99%  Physical Exam  Nursing note and vitals reviewed. Constitutional: She is oriented to person, place, and time. She appears well-developed and well-nourished.  HENT:  Head: Normocephalic and atraumatic.  Eyes: Conjunctivae and EOM are normal. Pupils are equal, round, and reactive to light.  Neck: Normal range of motion. Neck supple.  Cardiovascular: Normal  rate and regular rhythm.  Exam reveals no gallop and no friction rub.   No murmur heard. Pulmonary/Chest: Effort normal and breath sounds normal. No respiratory distress. She has no wheezes. She has no rales. She exhibits no tenderness.  Abdominal: Soft. Bowel sounds are normal. She exhibits no distension and no mass. There is no tenderness. There is no rebound and no guarding.  Musculoskeletal: Normal range of motion. She exhibits no edema and no tenderness.  Hip mildly painful with range of motion, nontender to palpation, no bony tenderness, no gross abnormality or deformity, ambulatory but painful  Left lumbar paraspinal muscles are very tight and tender to palpation  Neurological: She is alert and oriented to person, place, and time.  Skin: Skin is warm and dry.  Psychiatric: She has a normal mood and affect. Her behavior is normal. Judgment and thought content normal.    ED Course  Procedures (including critical care time)  Labs Reviewed - No data to display Dg Hip Complete Left  01/14/2013  *RADIOLOGY REPORT*  Clinical Data: Left hip pain for years  LEFT HIP - COMPLETE 2+ VIEW  Comparison: None.  Findings: Three views of the left hip submitted.  No acute fracture or subluxation.  Mild degenerative changes are noted bilateral hip joints with superior narrowing of joint space.  IMPRESSION: No acute fracture or subluxation.  Mild degenerative changes bilateral hip joints.   Original Report Authenticated By: Natasha Mead, M.D.      1. Hip pain, left       MDM  57 year old female with left hip pain. She states that she twisted funny today, and reinjured her left hip. Plain films are unrevealing. However, her low back muscles are very tight to palpation. Will treat with Robaxin and ice. Patient has hydrocodone at home. Patient requests a note for work, told her that I put her on light duty, she requests that I keep her out of work period. Do not feel this is indicated. I told her to speak  with her supervisor about  light duty activities.  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 I told her that she needs to followup with orthopedics for the hip replacement. 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 She has narcotic pain medicine at home. I will not give her anymore here today. Will treat with Robaxin and ice.         Roxy Horseman, PA-C 01/14/13 1534

## 2013-01-14 NOTE — ED Notes (Signed)
Pt c/o left hip pain after twisting while at work today; pt sts having pain and unable to stand; pt denies fall

## 2013-01-14 NOTE — ED Notes (Signed)
Patient c/o left hip pain onset in Jan. States she twisted this am and pain in hip increased. Ambulates with difficulty.

## 2013-01-16 NOTE — ED Provider Notes (Signed)
Medical screening examination/treatment/procedure(s) were performed by non-physician practitioner and as supervising physician I was immediately available for consultation/collaboration.   Gavin Pound. Mavis Gravelle, MD 01/16/13 1150

## 2013-02-03 NOTE — ED Notes (Signed)
Referral faxed to [facility] for hip pain

## 2013-02-05 NOTE — ED Notes (Signed)
Patient unable to be scheduled at pain management Patient is aware

## 2013-02-10 ENCOUNTER — Encounter: Payer: Self-pay | Admitting: Emergency Medicine

## 2013-02-10 ENCOUNTER — Emergency Department (INDEPENDENT_AMBULATORY_CARE_PROVIDER_SITE_OTHER)
Admission: EM | Admit: 2013-02-10 | Discharge: 2013-02-10 | Disposition: A | Discharge status: Home Health | Payer: No Typology Code available for payment source | Source: Home / Self Care

## 2013-02-10 ENCOUNTER — Encounter (HOSPITAL_COMMUNITY): Payer: Self-pay | Admitting: *Deleted

## 2013-02-10 DIAGNOSIS — Z760 Encounter for issue of repeat prescription: Secondary | ICD-10-CM

## 2013-02-10 DIAGNOSIS — M87051 Idiopathic aseptic necrosis of right femur: Secondary | ICD-10-CM

## 2013-02-10 DIAGNOSIS — M87059 Idiopathic aseptic necrosis of unspecified femur: Secondary | ICD-10-CM

## 2013-02-10 MED ORDER — KETOROLAC TROMETHAMINE 10 MG PO TABS: 10.0000 mg | ORAL_TABLET | Freq: Four times a day (QID) | ORAL | Status: DC | PRN

## 2013-02-10 MED ORDER — CYCLOBENZAPRINE HCL 10 MG PO TABS: 20.0000 mg | ORAL_TABLET | Freq: Every day | ORAL | Status: DC

## 2013-02-10 MED ORDER — HYDROCODONE-ACETAMINOPHEN 10-325 MG PO TABS: 1.0000 | ORAL_TABLET | Freq: Three times a day (TID) | ORAL | Status: DC | PRN

## 2013-02-10 NOTE — ED Provider Notes (Signed)
History     CSN: [id number]  Arrival date & time 02/10/13  1004   First MD Initiated Contact with Patient 02/10/13 1020      Chief Complaint  Patient presents with  . Medication Refill    HPI  57 year old female with history of asthma, hypertension, hyperlipidemia , with chronic left hip pain with  avascular necrosis presents for medication refills primarily including pain medications for her left hip pain. Patient informs that her left hip pain remained persistent and worsened with ambulation and movement. Denies radiation of the pain. On her recent visit she had a DEXA scan which will radiate T score of -0.9 with a low fracture risk. X-ray of the hip showed mild degenerative changes without fracture or subluxation. Patient is in the process of taking an appointment with orthopedic clinic at [facility]. He denies any fever, chills, headache, blurry vision, chest pain, palpitations, shortness of breath, abdominal pain, nausea, vomiting, bowel or urinary symptoms. Past Medical History  Diagnosis Date  . Asthma   . Hypertension   . Hyperlipidemia   . Scoliosis   . Insomnia   . Mitral valve prolapse     Past Surgical History  Procedure Laterality Date  . Finger surgery  57 years old    does not remember why, also has scar on side of abdomen    Family History  Problem Relation Age of Onset  . Colon cancer Paternal Uncle   . Throat cancer Maternal Aunt   . Stroke Mother   . Heart attack Father     History  Substance Use Topics  . Smoking status: Current Every Day Smoker -- 0.30 packs/day    Types: Cigarettes  . Smokeless tobacco: Not on file  . Alcohol Use: Yes     Comment: rarely    OB History   Grav Para Term Preterm Abortions TAB SAB Ect Mult Living                  Review of Systems  Allergies  Review of patient's allergies indicates no known allergies.  Home Medications   Current Outpatient Rx  Name  Route  Sig  Dispense  Refill  . albuterol  (PROVENTIL HFA;VENTOLIN HFA) 108 (90 BASE) MCG/ACT inhaler   Inhalation   Inhale 2 puffs into the lungs every 6 (six) hours as needed for wheezing.   1 Inhaler   11   . Ascorbic Acid (VITAMIN C) 1000 MG tablet   Oral   Take 1,000 mg by mouth at bedtime.          Marland Kitchen b complex vitamins tablet   Oral   Take 1 tablet by mouth daily.         . Calcium Carbonate-Vitamin D (CALCIUM-VITAMIN D) 500-200 MG-UNIT per tablet   Oral   Take 1 tablet by mouth daily.   30 tablet   11   . Flaxseed, Linseed, (FLAX SEEDS PO)   Oral   Take 1 capsule by mouth daily.         . Melatonin 5 MG CAPS   Oral   Take 5 mg by mouth at bedtime.         Marland Kitchen omeprazole (PRILOSEC) 20 MG capsule      Take 1 tablet twice a day for one week then one tablet daily   50 capsule   1   . ranitidine (ZANTAC) 150 MG tablet   Oral   Take 1 tablet (150 mg total) by mouth 2 (  two) times daily.   60 tablet   2   . simvastatin (ZOCOR) 40 MG tablet   Oral   Take 40 mg by mouth every evening.         . traZODone (DESYREL) 100 MG tablet   Oral   Take 1 tablet (100 mg total) by mouth at bedtime.   30 tablet   11   . cyclobenzaprine (FLEXERIL) 10 MG tablet   Oral   Take 2 tablets (20 mg total) by mouth at bedtime.   30 tablet   0   . HYDROcodone-acetaminophen (NORCO) 10-325 MG per tablet   Oral   Take 1 tablet by mouth every 8 (eight) hours as needed for pain.   40 tablet   0   . ketorolac (TORADOL) 10 MG tablet   Oral   Take 1 tablet (10 mg total) by mouth every 6 (six) hours as needed for pain.   40 tablet   0   . methocarbamol (ROBAXIN) 500 MG tablet   Oral   Take 1 tablet (500 mg total) by mouth 2 (two) times daily.   20 tablet   0     BP 148/86  Pulse 78  Temp(Src) 97.9 F (36.6 C) (Oral)  SpO2 100%  Physical Exam Elderly female in no acute distress HEENT: No pallor, musculomucosal, Chest: Clear to auscultation bilaterally CVS: Normal S1 and S2 Abdomen: Soft,  nontender Extremities: Tender range of motion of left hip Cns: AAOX3 ED Course  Procedures (including critical care time)  Labs Reviewed - No data to display No results found.   1. Avascular necrosis of bones of both hips   2. Medication refill    Pain medications including Vicodin, Toradol and Flexeril have been refilled.  Patient in the process of making an appointment at [facility] for orthopedic evaluation for her left hip. I have encouraged her to make an appointment as early as possible. Continued remaining home medications. Followup in 1- 2 months.   MDM          Eddie North, MD 02/10/13 1104

## 2013-02-10 NOTE — ED Notes (Signed)
Patient presents for medication refill. [facility] and [facility] for pain medications.

## 2013-02-18 NOTE — ED Notes (Signed)
Referral faxed to [facility] 

## 2013-02-26 MED ORDER — HYDROCODONE-ACETAMINOPHEN 10-325 MG PO TABS: 1.0000 | ORAL_TABLET | Freq: Three times a day (TID) | ORAL | Status: DC | PRN

## 2013-02-27 ENCOUNTER — Emergency Department (HOSPITAL_COMMUNITY)
Admission: EM | Admit: 2013-02-27 | Discharge: 2013-02-27 | Discharge status: Against Medical Advice | Payer: Medicaid Other | Attending: Emergency Medicine | Admitting: Emergency Medicine

## 2013-02-27 ENCOUNTER — Emergency Department (HOSPITAL_COMMUNITY): Payer: Medicaid Other

## 2013-02-27 ENCOUNTER — Encounter (HOSPITAL_COMMUNITY): Payer: Self-pay | Admitting: Emergency Medicine

## 2013-02-27 DIAGNOSIS — F17200 Nicotine dependence, unspecified, uncomplicated: Secondary | ICD-10-CM | POA: Insufficient documentation

## 2013-02-27 DIAGNOSIS — I10 Essential (primary) hypertension: Secondary | ICD-10-CM | POA: Insufficient documentation

## 2013-02-27 DIAGNOSIS — J45909 Unspecified asthma, uncomplicated: Secondary | ICD-10-CM | POA: Insufficient documentation

## 2013-02-27 DIAGNOSIS — G8929 Other chronic pain: Secondary | ICD-10-CM | POA: Insufficient documentation

## 2013-02-27 DIAGNOSIS — M25559 Pain in unspecified hip: Secondary | ICD-10-CM | POA: Insufficient documentation

## 2013-02-27 NOTE — ED Notes (Signed)
Believes that she has some mild swelling lower lt back/buttock area .

## 2013-02-27 NOTE — ED Notes (Signed)
Pt c/o left sided hip pain that is chronic in nature; pt denies new injury; pt sts started while walking

## 2013-02-27 NOTE — ED Notes (Signed)
Went into room to check on pt , room noted to be empty. Pt had enquired about wait time too this nurse approx 20 minutes earlier, Stated that she had to have her son's car back home by 1500.

## 2013-02-28 DIAGNOSIS — Z0289 Encounter for other administrative examinations: Secondary | ICD-10-CM

## 2013-04-23 NOTE — Progress Notes (Signed)
Quick Note:  Please schedule F/U appt to see if lipids still elevated.  Annette Andrade 04/23/2013 3:25 PM  ______

## 2013-05-22 ENCOUNTER — Other Ambulatory Visit: Payer: Self-pay | Admitting: Orthopedic Surgery

## 2013-06-25 ENCOUNTER — Encounter (HOSPITAL_COMMUNITY): Payer: Self-pay

## 2013-06-30 ENCOUNTER — Encounter (HOSPITAL_COMMUNITY): Payer: Self-pay | Admitting: Surgery

## 2013-06-30 ENCOUNTER — Encounter (HOSPITAL_COMMUNITY)
Admission: RE | Admit: 2013-06-30 | Discharge: 2013-06-30 | Disposition: A | Discharge status: Home / Self Care | Payer: Medicaid Other | Source: Ambulatory Visit | Attending: Orthopedic Surgery | Admitting: Orthopedic Surgery

## 2013-06-30 DIAGNOSIS — Z01818 Encounter for other preprocedural examination: Secondary | ICD-10-CM | POA: Insufficient documentation

## 2013-06-30 DIAGNOSIS — Z01812 Encounter for preprocedural laboratory examination: Secondary | ICD-10-CM | POA: Insufficient documentation

## 2013-06-30 LAB — URINE MICROSCOPIC-ADD ON

## 2013-06-30 LAB — CBC WITH DIFFERENTIAL/PLATELET
Basophils Absolute: 0 10*3/uL (ref 0.0–0.1)
Basophils Relative: 0 % (ref 0–1)
HCT: 38.5 % (ref 36.0–46.0)
Lymphocytes Relative: 41 % (ref 12–46)
Monocytes Absolute: 0.5 10*3/uL (ref 0.1–1.0)
Neutro Abs: 5.2 10*3/uL (ref 1.7–7.7)
Neutrophils Relative %: 51 % (ref 43–77)
RDW: 13.9 % (ref 11.5–15.5)
WBC: 10 10*3/uL (ref 4.0–10.5)

## 2013-06-30 LAB — URINALYSIS, ROUTINE W REFLEX MICROSCOPIC
Bilirubin Urine: NEGATIVE
Glucose, UA: NEGATIVE mg/dL
Hgb urine dipstick: NEGATIVE
Ketones, ur: 15 mg/dL — AB
Protein, ur: NEGATIVE mg/dL
Urobilinogen, UA: 1 mg/dL (ref 0.0–1.0)

## 2013-06-30 LAB — APTT: aPTT: 35 seconds (ref 24–37)

## 2013-06-30 LAB — ABO/RH: ABO/RH(D): A NEG

## 2013-06-30 LAB — TYPE AND SCREEN
ABO/RH(D): A NEG
Antibody Screen: NEGATIVE

## 2013-06-30 LAB — BASIC METABOLIC PANEL
Chloride: 102 mEq/L (ref 96–112)
Creatinine, Ser: 0.68 mg/dL (ref 0.50–1.10)
GFR calc Af Amer: >90 mL/min (ref >90)
GFR calc non Af Amer: >90 mL/min (ref >90)
Potassium: 3.8 mEq/L (ref 3.5–5.1)

## 2013-06-30 LAB — PROTIME-INR
INR: 0.95 (ref 0.00–1.49)
Prothrombin Time: 12.5 seconds (ref 11.6–15.2)

## 2013-06-30 NOTE — Progress Notes (Signed)
Patient returned call and informed Nurse that her pre-admit appointment was not today and it was scheduled for the 12th. Nurse called Dondra Spry, Diplomatic Services operational officer and questioned her about this and Dondra Spry stated that patients appointment was today. Will do health history over the phone and have patient come in for per-admit labs.

## 2013-06-30 NOTE — Progress Notes (Signed)
PCP is Dr. Loleta Chance and [facility]. Patient denied any cardiac or pulmonary history.

## 2013-06-30 NOTE — Progress Notes (Signed)
Nurse called patient and left a voicemail inquiring about 1200 pre-admit appointment.

## 2013-06-30 NOTE — Pre-Procedure Instructions (Signed)
Cleo SUJATA MAINES  06/30/2013   Your procedure is scheduled on:  Monday July 07, 2013.  Report to [facility] at 5:30 AM.  Call this number if you have problems the morning of surgery: [phone]   Remember:   Do not eat food or drink liquids after midnight.   Take these medicines the morning of surgery with A SIP OF WATER: Albuterol Inhaler if needed for wheezing, Hydrocodone if needed for pain, Omeprazole (Prilosec)    Do not wear jewelry, make-up or nail polish.  Do not wear lotions, powders, or perfumes. You may wear deodorant.  Do not shave 48 hours prior to surgery.   Do not bring valuables to the hospital.  [facility] is not responsible for any belongings or valuables.  Contacts, dentures or bridgework may not be worn into surgery.  Leave suitcase in the car. After surgery it may be brought to your room.  For patients admitted to the hospital, checkout time is 11:00 AM the day of discharge.   Patients discharged the day of surgery will not be allowed to drive home.  Name and phone number of your driver: Family/Friend  Special Instructions: Shower using CHG 2 nights before surgery and the night before surgery.  If you shower the day of surgery use CHG.  Use special wash - you have one bottle of CHG for all showers.  You should use approximately 1/3 of the bottle for each shower.   Please read over the following fact sheets that you were given: Pain Booklet, Coughing and Deep Breathing, Blood Transfusion Information, Total Joint Packet, MRSA Information and Surgical Site Infection Prevention

## 2013-07-01 NOTE — Progress Notes (Signed)
Anesthesia Chart Review:  Patient is a 57 year old scheduled for left THA on 07/07/13 by Dr. Turner Daniels.  History includes smoking, asthma, HTN, HLD, murmur with history of MVP, prior PNA, GERD, eczema, scoliosis, glaucoma, anal fissure repair, finger surgery.  PCP is Dr. Loleta Chance at the [facility].  EKG on 10/19/12 showed NSR, first degree AVB, incomplete right BBB.   CXR on 10/19/12 showed stable exam without acute cardiopulmonary findings.  Preoperative labs noted.  Preoperative diagnostic studies appear acceptable.  She will be evaluated by her assigned anesthesiologist on the day of surgery but would anticipate she could proceed as planned if no acute changes.  Velna Ochs [facility]/Anesthesiology Phone [phone] 07/01/2013 11:10 AM

## 2013-07-03 NOTE — H&P (Signed)
Annette Andrade is an 57 y.o. female.    Chief Complaint: Left Hip Pain  HPI: Patient seen in consultation from Dr. Althea Charon for avascular necrosis of left greater than right hip.  As long she takes 1 or 2 of the 10 mg hydrocodone she is able to tolerate the pain and get some sleep at night, but it is getting progressively worse.  MRI scan shows focal AVN of the right hip, but the left hip has fairly global AVN with marrow edema to the femoral neck.  She walks with a fairly profound left-sided limp and antalgic gait despite the use of a cane.  She has some part-time work as a Lawyer but the pain is getting bad enough that she does not think she can work anymore.  She is here to discuss hip replacement on the left side and has actually done a fair amount of research on her own.  A cortisone injection with Dr. Regino Schultze provided 1 day of pain relief.  Past Medical History  Diagnosis Date  . Asthma   . Hypertension   . Hyperlipidemia   . Scoliosis   . Insomnia   . Mitral valve prolapse   . Heart murmur   . Pneumonia     hx of  . Shortness of breath   . GERD (gastroesophageal reflux disease)   . Eczema   . Glaucoma     bilateral    Past Surgical History  Procedure Laterality Date  . Finger surgery  57 years old    does not remember why, also has scar on side of abdomen  . Anal fissure repair      Family History  Problem Relation Age of Onset  . Colon cancer Paternal Uncle   . Throat cancer Maternal Aunt   . Stroke Mother   . Heart attack Father    Social History:  reports that she has been smoking Cigarettes.  She has a 20 pack-year smoking history. She does not have any smokeless tobacco history on file. She reports that  drinks alcohol. She reports that she does not use illicit drugs.  Allergies: No Known Allergies  No prescriptions prior to admission    No results found for this or any previous visit (from the past 48 hour(s)). No results found.  Review of Systems   Constitutional: Negative.   HENT: Negative.   Eyes: Negative.   Respiratory: Positive for shortness of breath.   Cardiovascular: Negative.   Gastrointestinal: Negative.   Genitourinary: Negative.   Musculoskeletal: Positive for joint pain (left hip).  Skin: Negative.   Neurological: Negative.        Intermittent headaches  Psychiatric/Behavioral: Negative.     Height 5\' 7"  (1.702 m). Physical Exam  Constitutional: She is oriented to person, place, and time. She appears well-developed and well-nourished.  HENT:  Head: Normocephalic and atraumatic.  Neck: Normal range of motion. Neck supple.  Cardiovascular: Intact distal pulses.   Respiratory: Effort normal and breath sounds normal.  Musculoskeletal: She exhibits tenderness (left hip).  Neurological: She is alert and oriented to person, place, and time.  Skin: Skin is warm and dry.  Psychiatric: She has a normal mood and affect. Her behavior is normal. Judgment and thought content normal.     Assessment/Plan Assess: Avascular necrosis of left hip, with impending collapse of the femoral head and increasing pain.  Plan: We will get the patient set up for left hip replacement surgery at her convenience.  The risks and benefits  were discussed at length with her.  She can anticipate.  A two to three-day hospital stay, use of a walker for a week, use of a cane for a week, and return to light duty after 4-6 weeks.  She may call and a few weeks to refill her 10 mg, hydrocodone is one by mouth twice a day as she does not need a refill today.  PHILLIPS, ERIC R 07/03/2013, 5:05 PM

## 2013-07-06 MED ORDER — CEFAZOLIN SODIUM-DEXTROSE 2-3 GM-% IV SOLR
2.0000 g | INTRAVENOUS | Status: AC
  Administered 2013-07-07: 2 g via INTRAVENOUS
  Filled 2013-07-06: qty 50

## 2013-07-07 ENCOUNTER — Ambulatory Visit (HOSPITAL_COMMUNITY): Payer: Medicaid Other | Admitting: Certified Registered"

## 2013-07-07 ENCOUNTER — Inpatient Hospital Stay (HOSPITAL_COMMUNITY): Payer: Medicaid Other

## 2013-07-07 ENCOUNTER — Inpatient Hospital Stay (HOSPITAL_COMMUNITY)
Admission: RE | Admit: 2013-07-07 | Discharge: 2013-07-09 | DRG: 470 | Disposition: A | Discharge status: Home Health | Payer: Medicaid Other | Source: Ambulatory Visit | Attending: Orthopedic Surgery | Admitting: Orthopedic Surgery

## 2013-07-07 ENCOUNTER — Encounter (HOSPITAL_COMMUNITY): Payer: Self-pay | Admitting: Certified Registered"

## 2013-07-07 ENCOUNTER — Encounter (HOSPITAL_COMMUNITY)
Admission: RE | Disposition: A | Discharge status: Home Health | Payer: Self-pay | Source: Ambulatory Visit | Attending: Orthopedic Surgery

## 2013-07-07 ENCOUNTER — Encounter (HOSPITAL_COMMUNITY): Payer: Self-pay | Admitting: Vascular Surgery

## 2013-07-07 DIAGNOSIS — K219 Gastro-esophageal reflux disease without esophagitis: Secondary | ICD-10-CM | POA: Diagnosis present

## 2013-07-07 DIAGNOSIS — H409 Unspecified glaucoma: Secondary | ICD-10-CM | POA: Diagnosis present

## 2013-07-07 DIAGNOSIS — M4120 Other idiopathic scoliosis, site unspecified: Secondary | ICD-10-CM | POA: Diagnosis present

## 2013-07-07 DIAGNOSIS — J45909 Unspecified asthma, uncomplicated: Secondary | ICD-10-CM | POA: Diagnosis present

## 2013-07-07 DIAGNOSIS — F17200 Nicotine dependence, unspecified, uncomplicated: Secondary | ICD-10-CM | POA: Diagnosis present

## 2013-07-07 DIAGNOSIS — Z823 Family history of stroke: Secondary | ICD-10-CM

## 2013-07-07 DIAGNOSIS — I10 Essential (primary) hypertension: Secondary | ICD-10-CM | POA: Diagnosis present

## 2013-07-07 DIAGNOSIS — Z8249 Family history of ischemic heart disease and other diseases of the circulatory system: Secondary | ICD-10-CM

## 2013-07-07 DIAGNOSIS — M87052 Idiopathic aseptic necrosis of left femur: Secondary | ICD-10-CM | POA: Diagnosis present

## 2013-07-07 DIAGNOSIS — L259 Unspecified contact dermatitis, unspecified cause: Secondary | ICD-10-CM | POA: Diagnosis present

## 2013-07-07 DIAGNOSIS — M87059 Idiopathic aseptic necrosis of unspecified femur: Principal | ICD-10-CM | POA: Diagnosis present

## 2013-07-07 DIAGNOSIS — G4700 Insomnia, unspecified: Secondary | ICD-10-CM | POA: Diagnosis present

## 2013-07-07 DIAGNOSIS — E785 Hyperlipidemia, unspecified: Secondary | ICD-10-CM | POA: Diagnosis present

## 2013-07-07 DIAGNOSIS — Z7982 Long term (current) use of aspirin: Secondary | ICD-10-CM

## 2013-07-07 DIAGNOSIS — Z79899 Other long term (current) drug therapy: Secondary | ICD-10-CM

## 2013-07-07 HISTORY — DX: Pneumonia, unspecified organism: J18.9

## 2013-07-07 HISTORY — DX: Shortness of breath: R06.02

## 2013-07-07 HISTORY — DX: Gastro-esophageal reflux disease without esophagitis: K21.9

## 2013-07-07 HISTORY — DX: Unspecified glaucoma: H40.9

## 2013-07-07 HISTORY — PX: TOTAL HIP ARTHROPLASTY: SHX124

## 2013-07-07 HISTORY — DX: Cardiac murmur, unspecified: R01.1

## 2013-07-07 HISTORY — DX: Dermatitis, unspecified: L30.9

## 2013-07-07 SURGERY — ARTHROPLASTY, HIP, TOTAL,POSTERIOR APPROACH
Anesthesia: General | Site: Hip | Laterality: Left | Wound class: Clean

## 2013-07-07 MED ORDER — OXYCODONE HCL 5 MG PO TABS: 5.0000 mg | ORAL_TABLET | Freq: Once | ORAL | Status: DC | PRN

## 2013-07-07 MED ORDER — METHOCARBAMOL 100 MG/ML IJ SOLN
500.0000 mg | Freq: Four times a day (QID) | INTRAVENOUS | Status: DC | PRN
  Filled 2013-07-07: qty 5

## 2013-07-07 MED ORDER — PROMETHAZINE HCL 25 MG/ML IJ SOLN: 6.2500 mg | INTRAMUSCULAR | Status: DC | PRN

## 2013-07-07 MED ORDER — ASPIRIN EC 325 MG PO TBEC: 325.0000 mg | DELAYED_RELEASE_TABLET | Freq: Two times a day (BID) | ORAL | Status: DC

## 2013-07-07 MED ORDER — METHOCARBAMOL 500 MG PO TABS
ORAL_TABLET | ORAL | Status: AC
  Filled 2013-07-07: qty 1

## 2013-07-07 MED ORDER — PHENOL 1.4 % MT LIQD
1.0000 | OROMUCOSAL | Status: DC | PRN
  Administered 2013-07-07: 1 via OROMUCOSAL
  Filled 2013-07-07: qty 177

## 2013-07-07 MED ORDER — LIDOCAINE HCL (CARDIAC) 20 MG/ML IV SOLN
INTRAVENOUS | Status: DC | PRN
  Administered 2013-07-07: 25 mg via INTRAVENOUS

## 2013-07-07 MED ORDER — HYDROMORPHONE HCL PF 1 MG/ML IJ SOLN
1.0000 mg | INTRAMUSCULAR | Status: DC | PRN
  Administered 2013-07-07 – 2013-07-09 (×2): 1 mg via INTRAVENOUS
  Filled 2013-07-07 (×3): qty 1

## 2013-07-07 MED ORDER — LACTATED RINGERS IV SOLN
INTRAVENOUS | Status: DC | PRN
  Administered 2013-07-07 (×2): via INTRAVENOUS

## 2013-07-07 MED ORDER — MENTHOL 3 MG MT LOZG: 1.0000 | LOZENGE | OROMUCOSAL | Status: DC | PRN

## 2013-07-07 MED ORDER — METOCLOPRAMIDE HCL 5 MG PO TABS
5.0000 mg | ORAL_TABLET | Freq: Three times a day (TID) | ORAL | Status: DC | PRN
  Filled 2013-07-07: qty 2

## 2013-07-07 MED ORDER — NEOSTIGMINE METHYLSULFATE 1 MG/ML IJ SOLN
INTRAMUSCULAR | Status: DC | PRN
  Administered 2013-07-07: 3 mg via INTRAVENOUS

## 2013-07-07 MED ORDER — MIDAZOLAM HCL 5 MG/5ML IJ SOLN
INTRAMUSCULAR | Status: DC | PRN
  Administered 2013-07-07: 2 mg via INTRAVENOUS

## 2013-07-07 MED ORDER — SODIUM CHLORIDE 0.9 % IR SOLN
Status: DC | PRN
  Administered 2013-07-07: 1000 mL

## 2013-07-07 MED ORDER — OXYCODONE HCL 5 MG PO TABS
5.0000 mg | ORAL_TABLET | ORAL | Status: DC | PRN
  Administered 2013-07-07 – 2013-07-09 (×8): 10 mg via ORAL
  Administered 2013-07-09: 5 mg via ORAL
  Filled 2013-07-07: qty 1
  Filled 2013-07-07 (×5): qty 2
  Filled 2013-07-07: qty 3
  Filled 2013-07-07 (×2): qty 2

## 2013-07-07 MED ORDER — ARTIFICIAL TEARS OP OINT
TOPICAL_OINTMENT | OPHTHALMIC | Status: DC | PRN
  Administered 2013-07-07: 1 via OPHTHALMIC

## 2013-07-07 MED ORDER — BUPIVACAINE-EPINEPHRINE 0.5% -1:200000 IJ SOLN
INTRAMUSCULAR | Status: DC | PRN
  Administered 2013-07-07: 10 mL

## 2013-07-07 MED ORDER — MAGNESIUM CITRATE PO SOLN
1.0000 | Freq: Once | ORAL | Status: AC | PRN
  Filled 2013-07-07: qty 296

## 2013-07-07 MED ORDER — OXYCODONE-ACETAMINOPHEN 5-325 MG PO TABS: 1.0000 | ORAL_TABLET | ORAL | Status: DC | PRN

## 2013-07-07 MED ORDER — PANTOPRAZOLE SODIUM 40 MG PO TBEC
40.0000 mg | DELAYED_RELEASE_TABLET | Freq: Every day | ORAL | Status: DC
  Administered 2013-07-07 – 2013-07-09 (×3): 40 mg via ORAL
  Filled 2013-07-07 (×2): qty 1

## 2013-07-07 MED ORDER — DEXTROSE-NACL 5-0.45 % IV SOLN: INTRAVENOUS | Status: DC

## 2013-07-07 MED ORDER — MIDAZOLAM HCL 2 MG/2ML IJ SOLN: 0.5000 mg | Freq: Once | INTRAMUSCULAR | Status: DC | PRN

## 2013-07-07 MED ORDER — MEPERIDINE HCL 25 MG/ML IJ SOLN: 6.2500 mg | INTRAMUSCULAR | Status: DC | PRN

## 2013-07-07 MED ORDER — FAMOTIDINE 20 MG PO TABS
20.0000 mg | ORAL_TABLET | Freq: Every day | ORAL | Status: DC
  Administered 2013-07-07 – 2013-07-09 (×3): 20 mg via ORAL
  Filled 2013-07-07 (×3): qty 1

## 2013-07-07 MED ORDER — LATANOPROST 0.005 % OP SOLN
1.0000 [drp] | Freq: Every day | OPHTHALMIC | Status: DC
  Administered 2013-07-07 – 2013-07-08 (×2): 1 [drp] via OPHTHALMIC
  Filled 2013-07-07: qty 2.5

## 2013-07-07 MED ORDER — ROCURONIUM BROMIDE 100 MG/10ML IV SOLN
INTRAVENOUS | Status: DC | PRN
  Administered 2013-07-07: 30 mg via INTRAVENOUS
  Administered 2013-07-07: 10 mg via INTRAVENOUS

## 2013-07-07 MED ORDER — ASPIRIN EC 325 MG PO TBEC
325.0000 mg | DELAYED_RELEASE_TABLET | Freq: Every day | ORAL | Status: DC
  Administered 2013-07-08 – 2013-07-09 (×2): 325 mg via ORAL
  Filled 2013-07-07 (×3): qty 1

## 2013-07-07 MED ORDER — FLUTICASONE PROPIONATE HFA 44 MCG/ACT IN AERO
2.0000 | INHALATION_SPRAY | Freq: Two times a day (BID) | RESPIRATORY_TRACT | Status: DC
  Administered 2013-07-07 – 2013-07-09 (×4): 2 via RESPIRATORY_TRACT
  Filled 2013-07-07: qty 10.6

## 2013-07-07 MED ORDER — ONDANSETRON HCL 4 MG PO TABS: 4.0000 mg | ORAL_TABLET | Freq: Four times a day (QID) | ORAL | Status: DC | PRN

## 2013-07-07 MED ORDER — HYDROMORPHONE HCL PF 1 MG/ML IJ SOLN
0.2500 mg | INTRAMUSCULAR | Status: DC | PRN
  Administered 2013-07-07 (×2): 0.5 mg via INTRAVENOUS

## 2013-07-07 MED ORDER — OXYCODONE HCL 5 MG PO TABS
ORAL_TABLET | ORAL | Status: AC
  Administered 2013-07-07: 5 mg
  Filled 2013-07-07: qty 1

## 2013-07-07 MED ORDER — PHENYLEPHRINE HCL 10 MG/ML IJ SOLN
INTRAMUSCULAR | Status: DC | PRN
  Administered 2013-07-07 (×2): 120 ug via INTRAVENOUS
  Administered 2013-07-07: 160 ug via INTRAVENOUS
  Administered 2013-07-07: 200 ug via INTRAVENOUS

## 2013-07-07 MED ORDER — PROPOFOL 10 MG/ML IV BOLUS
INTRAVENOUS | Status: DC | PRN
  Administered 2013-07-07: 70 mg via INTRAVENOUS

## 2013-07-07 MED ORDER — LISINOPRIL-HYDROCHLOROTHIAZIDE 20-25 MG PO TABS: 1.0000 | ORAL_TABLET | Freq: Every day | ORAL | Status: DC

## 2013-07-07 MED ORDER — BISACODYL 5 MG PO TBEC: 5.0000 mg | DELAYED_RELEASE_TABLET | Freq: Every day | ORAL | Status: DC | PRN

## 2013-07-07 MED ORDER — GLYCOPYRROLATE 0.2 MG/ML IJ SOLN
INTRAMUSCULAR | Status: DC | PRN
  Administered 2013-07-07: 0.4 mg via INTRAVENOUS

## 2013-07-07 MED ORDER — ONDANSETRON HCL 4 MG/2ML IJ SOLN
INTRAMUSCULAR | Status: DC | PRN
  Administered 2013-07-07: 4 mg via INTRAVENOUS

## 2013-07-07 MED ORDER — ALBUTEROL SULFATE HFA 108 (90 BASE) MCG/ACT IN AERS: 4.0000 | INHALATION_SPRAY | Freq: Four times a day (QID) | RESPIRATORY_TRACT | Status: DC | PRN

## 2013-07-07 MED ORDER — LISINOPRIL 20 MG PO TABS
20.0000 mg | ORAL_TABLET | Freq: Every day | ORAL | Status: DC
  Administered 2013-07-07 – 2013-07-09 (×3): 20 mg via ORAL
  Filled 2013-07-07 (×3): qty 1

## 2013-07-07 MED ORDER — METHOCARBAMOL 500 MG PO TABS
500.0000 mg | ORAL_TABLET | Freq: Four times a day (QID) | ORAL | Status: DC | PRN
  Administered 2013-07-07 – 2013-07-09 (×7): 500 mg via ORAL
  Filled 2013-07-07 (×7): qty 1

## 2013-07-07 MED ORDER — ONDANSETRON HCL 4 MG/2ML IJ SOLN: 4.0000 mg | Freq: Four times a day (QID) | INTRAMUSCULAR | Status: DC | PRN

## 2013-07-07 MED ORDER — OXYCODONE HCL 5 MG/5ML PO SOLN: 5.0000 mg | Freq: Once | ORAL | Status: DC | PRN

## 2013-07-07 MED ORDER — SENNOSIDES-DOCUSATE SODIUM 8.6-50 MG PO TABS: 1.0000 | ORAL_TABLET | Freq: Every evening | ORAL | Status: DC | PRN

## 2013-07-07 MED ORDER — HYDROMORPHONE HCL PF 1 MG/ML IJ SOLN
INTRAMUSCULAR | Status: AC
  Filled 2013-07-07: qty 2

## 2013-07-07 MED ORDER — HYDROCHLOROTHIAZIDE 25 MG PO TABS
25.0000 mg | ORAL_TABLET | Freq: Every day | ORAL | Status: DC
  Administered 2013-07-07 – 2013-07-09 (×3): 25 mg via ORAL
  Filled 2013-07-07 (×3): qty 1

## 2013-07-07 MED ORDER — METOCLOPRAMIDE HCL 5 MG/ML IJ SOLN: 5.0000 mg | Freq: Three times a day (TID) | INTRAMUSCULAR | Status: DC | PRN

## 2013-07-07 MED ORDER — FENTANYL CITRATE 0.05 MG/ML IJ SOLN
INTRAMUSCULAR | Status: DC | PRN
  Administered 2013-07-07 (×4): 50 ug via INTRAVENOUS
  Administered 2013-07-07: 100 ug via INTRAVENOUS
  Administered 2013-07-07: 50 ug via INTRAVENOUS

## 2013-07-07 MED ORDER — DOCUSATE SODIUM 100 MG PO CAPS
100.0000 mg | ORAL_CAPSULE | Freq: Two times a day (BID) | ORAL | Status: DC
  Administered 2013-07-07 – 2013-07-09 (×5): 100 mg via ORAL
  Filled 2013-07-07 (×6): qty 1

## 2013-07-07 MED ORDER — ACETAMINOPHEN 325 MG PO TABS: 650.0000 mg | ORAL_TABLET | Freq: Four times a day (QID) | ORAL | Status: DC | PRN

## 2013-07-07 MED ORDER — ACETAMINOPHEN 650 MG RE SUPP: 650.0000 mg | Freq: Four times a day (QID) | RECTAL | Status: DC | PRN

## 2013-07-07 MED ORDER — METHOCARBAMOL 500 MG PO TABS: 500.0000 mg | ORAL_TABLET | Freq: Two times a day (BID) | ORAL | Status: DC

## 2013-07-07 MED ORDER — CHLORHEXIDINE GLUCONATE 4 % EX LIQD: 60.0000 mL | Freq: Once | CUTANEOUS | Status: DC

## 2013-07-07 MED ORDER — KCL IN DEXTROSE-NACL 20-5-0.45 MEQ/L-%-% IV SOLN
INTRAVENOUS | Status: DC
  Administered 2013-07-07: 16:00:00 via INTRAVENOUS
  Filled 2013-07-07 (×8): qty 1000

## 2013-07-07 MED ORDER — BUPIVACAINE-EPINEPHRINE (PF) 0.5% -1:200000 IJ SOLN
INTRAMUSCULAR | Status: AC
  Filled 2013-07-07: qty 10

## 2013-07-07 MED ORDER — DIPHENHYDRAMINE HCL 12.5 MG/5ML PO ELIX: 12.5000 mg | ORAL_SOLUTION | ORAL | Status: DC | PRN

## 2013-07-07 SURGICAL SUPPLY — 58 items
BLADE SAW SGTL 18X1.27X75 (BLADE) ×2 IMPLANT
BRUSH FEMORAL CANAL (MISCELLANEOUS) IMPLANT
CAPT HIP PF COP ×2 IMPLANT
CLOTH BEACON ORANGE TIMEOUT ST (SAFETY) ×2 IMPLANT
COVER BACK TABLE 24X17X13 BIG (DRAPES) IMPLANT
COVER SURGICAL LIGHT HANDLE (MISCELLANEOUS) ×2 IMPLANT
DRAPE ORTHO SPLIT 77X108 STRL (DRAPES) ×1
DRAPE PROXIMA HALF (DRAPES) ×2 IMPLANT
DRAPE SURG ORHT 6 SPLT 77X108 (DRAPES) ×1 IMPLANT
DRAPE U-SHAPE 47X51 STRL (DRAPES) ×2 IMPLANT
DRILL BIT 7/64X5 (BIT) ×2 IMPLANT
DRSG AQUACEL AG ADV 3.5X10 (GAUZE/BANDAGES/DRESSINGS) ×2 IMPLANT
DRSG MEPILEX BORDER 4X12 (GAUZE/BANDAGES/DRESSINGS) IMPLANT
DRSG MEPILEX BORDER 4X8 (GAUZE/BANDAGES/DRESSINGS) IMPLANT
DURAPREP 26ML APPLICATOR (WOUND CARE) ×2 IMPLANT
ELECT BLADE 4.0 EZ CLEAN MEGAD (MISCELLANEOUS) ×2
ELECT REM PT RETURN 9FT ADLT (ELECTROSURGICAL) ×2
ELECTRODE BLDE 4.0 EZ CLN MEGD (MISCELLANEOUS) ×1 IMPLANT
ELECTRODE REM PT RTRN 9FT ADLT (ELECTROSURGICAL) ×1 IMPLANT
GAUZE XEROFORM 1X8 LF (GAUZE/BANDAGES/DRESSINGS) IMPLANT
GLOVE BIO SURGEON STRL SZ 6.5 (GLOVE) ×2 IMPLANT
GLOVE BIO SURGEON STRL SZ7.5 (GLOVE) ×2 IMPLANT
GLOVE BIO SURGEON STRL SZ8.5 (GLOVE) ×4 IMPLANT
GLOVE BIOGEL PI IND STRL 7.0 (GLOVE) ×2 IMPLANT
GLOVE BIOGEL PI IND STRL 8 (GLOVE) ×2 IMPLANT
GLOVE BIOGEL PI IND STRL 9 (GLOVE) ×1 IMPLANT
GLOVE BIOGEL PI INDICATOR 7.0 (GLOVE) ×2
GLOVE BIOGEL PI INDICATOR 8 (GLOVE) ×2
GLOVE BIOGEL PI INDICATOR 9 (GLOVE) ×1
GLOVE ECLIPSE 7.0 STRL STRAW (GLOVE) ×4 IMPLANT
GLOVE SURG SS PI 6.5 STRL IVOR (GLOVE) ×2 IMPLANT
GOWN PREVENTION PLUS XLARGE (GOWN DISPOSABLE) ×6 IMPLANT
GOWN STRL NON-REIN LRG LVL3 (GOWN DISPOSABLE) ×2 IMPLANT
GOWN STRL REIN XL XLG (GOWN DISPOSABLE) ×2 IMPLANT
HANDPIECE INTERPULSE COAX TIP (DISPOSABLE)
HOOD PEEL AWAY FACE SHEILD DIS (HOOD) ×4 IMPLANT
KIT BASIN OR (CUSTOM PROCEDURE TRAY) ×2 IMPLANT
KIT ROOM TURNOVER OR (KITS) ×2 IMPLANT
MANIFOLD NEPTUNE II (INSTRUMENTS) ×2 IMPLANT
NEEDLE 22X1 1/2 (OR ONLY) (NEEDLE) ×2 IMPLANT
NS IRRIG 1000ML POUR BTL (IV SOLUTION) ×2 IMPLANT
PACK TOTAL JOINT (CUSTOM PROCEDURE TRAY) ×2 IMPLANT
PAD ARMBOARD 7.5X6 YLW CONV (MISCELLANEOUS) ×4 IMPLANT
PASSER SUT SWANSON 36MM LOOP (INSTRUMENTS) ×2 IMPLANT
PRESSURIZER FEMORAL UNIV (MISCELLANEOUS) IMPLANT
SET HNDPC FAN SPRY TIP SCT (DISPOSABLE) IMPLANT
SUT ETHIBOND 2 V 37 (SUTURE) ×2 IMPLANT
SUT ETHILON 3 0 FSL (SUTURE) ×2 IMPLANT
SUT VIC AB 0 CTB1 27 (SUTURE) ×2 IMPLANT
SUT VIC AB 1 CTX 36 (SUTURE) ×1
SUT VIC AB 1 CTX36XBRD ANBCTR (SUTURE) ×1 IMPLANT
SUT VIC AB 2-0 CTB1 (SUTURE) ×2 IMPLANT
SYR CONTROL 10ML LL (SYRINGE) ×2 IMPLANT
TOWEL OR 17X24 6PK STRL BLUE (TOWEL DISPOSABLE) ×2 IMPLANT
TOWEL OR 17X26 10 PK STRL BLUE (TOWEL DISPOSABLE) ×2 IMPLANT
TOWER CARTRIDGE SMART MIX (DISPOSABLE) IMPLANT
TRAY FOLEY CATH 14FR (SET/KITS/TRAYS/PACK) IMPLANT
WATER STERILE IRR 1000ML POUR (IV SOLUTION) ×4 IMPLANT

## 2013-07-07 NOTE — Preoperative (Signed)
Beta Blockers   Reason not to administer Beta Blockers:Not Applicable 

## 2013-07-07 NOTE — Anesthesia Procedure Notes (Signed)
Procedure Name: Intubation Date/Time: 07/07/2013 7:55 AM Performed by: De Nurse Pre-anesthesia Checklist: Patient identified, Emergency Drugs available, Suction available, Patient being monitored and Timeout performed Patient Re-evaluated:Patient Re-evaluated prior to inductionOxygen Delivery Method: Circle system utilized Preoxygenation: Pre-oxygenation with 100% oxygen Intubation Type: IV induction Ventilation: Mask ventilation without difficulty Laryngoscope Size: Mac and 3 Grade View: Grade IV Tube type: Oral Tube size: 7.0 mm Number of attempts: 3 Airway Equipment and Method: Stylet and Video-laryngoscopy Placement Confirmation: ETT inserted through vocal cords under direct vision,  positive ETCO2 and breath sounds checked- equal and bilateral Secured at: 21 cm Tube secured with: Tape Dental Injury: Teeth and Oropharynx as per pre-operative assessment  Difficulty Due To: Difficulty was anticipated and 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 Patient VSS.

## 2013-07-07 NOTE — Transfer of Care (Signed)
Immediate Anesthesia Transfer of Care Note  Patient: Annette Andrade  Procedure(s) Performed: Procedure(s): LEFT TOTAL HIP ARTHROPLASTY (Left)  Patient Location: PACU  Anesthesia Type:General  Level of Consciousness: awake and oriented  Airway & Oxygen Therapy: Patient Spontanous Breathing and Patient connected to nasal cannula oxygen  Post-op Assessment: Report given to PACU RN and Patient moving all extremities X 4  Post vital signs: Reviewed and stable  Complications: No apparent anesthesia complications

## 2013-07-07 NOTE — Progress Notes (Signed)
UR COMPLETED  

## 2013-07-07 NOTE — Anesthesia Postprocedure Evaluation (Signed)
  Anesthesia Post-op Note  Patient: Annette Andrade  Procedure(s) Performed: Procedure(s): LEFT TOTAL HIP ARTHROPLASTY (Left)  Patient Location: PACU  Anesthesia Type:General  Level of Consciousness: awake, alert , oriented and patient cooperative  Airway and Oxygen Therapy: Patient Spontanous Breathing and Patient connected to nasal cannula oxygen  Post-op Pain: none  Post-op Assessment: Post-op Vital signs reviewed, Patient's Cardiovascular Status Stable, Respiratory Function Stable, Patent Airway, No signs of Nausea or vomiting and Pain level controlled  Post-op Vital Signs: Reviewed and stable  Complications: No apparent anesthesia complications

## 2013-07-07 NOTE — Op Note (Signed)
OPERATIVE REPORT    DATE OF PROCEDURE:  07/07/2013       PREOPERATIVE DIAGNOSIS:  LEFT HIP AVASCULAR NECROSIS                                                          POSTOPERATIVE DIAGNOSIS:  LEFT HIP AVASCULAR NECROSIS                                                           PROCEDURE:  L total hip arthroplasty using a 52 mm DePuy Pinnacle  Cup, [employer], 10-degree polyethylene liner index superior  and posterior, a +0 36 mm ceramic head, a 16x11x150x36 +6 SROM stem, 16BL Sleeve   SURGEON: Marykathleen Russi J    ASSISTANT:   Eric K. Gaylene Brooks  (present throughout entire procedure and necessary for timely completion of the procedure)   ANESTHESIA: General BLOOD LOSS: 300 FLUID REPLACEMENT: 1500 crystalloid DRAINS: Foley Catheter URINE OUTPUT: 300cc COMPLICATIONS: none    INDICATIONS FOR PROCEDURE: A 57 y.o. year-old With  LEFT HIP AVASCULAR NECROSIS   for 3 years, x-rays show bone-on-bone arthritic changes, as well as a varus neck shaft angle of 120. MRI scan shows a the and throughout the femoral head and femoral neck with sclerotic bone. Despite conservative measures with observation, anti-inflammatory medicine, narcotics, use of a cane, has severe unremitting pain and can ambulate only a few blocks before resting.  Patient desires elective L total hip arthroplasty to decrease pain and increase function. The risks, benefits, and alternatives were discussed at length including but not limited to the risks of infection, bleeding, nerve injury, stiffness, blood clots, the need for revision surgery, cardiopulmonary complications, among others, and they were willing to proceed. Questions answered     PROCEDURE IN DETAIL: The patient was identified by armband,  received preoperative IV antibiotics in the holding area at [facility], taken to the operating room , appropriate anesthetic monitors  were attached and general endotracheal anesthesia induced. Foley catheter  was inserted. Pt was rolled into the R lateral decubitus position and fixed there with a Stulberg Mark II pelvic clamp.  The L lower extremity was then prepped and draped  in the usual sterile fashion from the ankle to the hemipelvis. A time-out  procedure was performed. The skin along the lateral hip and thigh  infiltrated with 10 mL of 0.5% Marcaine and epinephrine solution. We  then made a posterolateral approach to the hip. With a #10 blade, a 15 cm  incision was made through the skin and subcutaneous tissue down to the level of the  IT band. Small bleeders were identified and cauterized. The IT band was cut in  line with skin incision exposing the greater trochanter. A Cobra retractor was placed between the gluteus minimus and the superior hip joint capsule, and a spiked Cobra between the quadratus femoris and the inferior hip joint capsule. This isolated the short  external rotators and piriformis tendons. These were tagged with a #2 Ethibond  suture and cut off their insertion on the intertrochanteric crest. The posterior  capsule was then developed into an  acetabular-based flap from Posterior Superior off of the acetabulum out over the femoral neck and back posterior inferior to the acetabular rim. This flap was tagged with two #2 Ethibond sutures and retracted protecting the sciatic nerve. This exposed the arthritic femoral head and osteophytes. The hip was then flexed and internally rotated, dislocating the femoral head and a standard neck cut performed 1 fingerbreadth above the lesser trochanter.  A spiked Cobra was placed in the cotyloid notch and a Hohmann retractor was then used to lever the femur anteriorly off of the anterior pelvic column. A posterior-inferior wing retractor was placed at the junction of the acetabulum and the ischium completing the acetabular exposure.We then removed the peripheral osteophytes and labrum from the acetabulum. We then reamed the acetabulum up to 51 mm with  basket reamers obtaining good coverage in all quadrants. We then irrigated with normal  saline solution and hammered into place a 52 mm pinnacle cup in 45  degrees of abduction and about 20 degrees of anteversion. More  peripheral osteophytes removed and a trial 10-degree liner placed with the  index superior-posterior. The hip was then flexed and internally rotated exposing the  proximal femur, which was entered with the initiating reamer followed by  the axial reamers up to a 11.5 mm full depth and 12mm partial depth. We then conically reamed to 16B to the correct depth for a 36 base neck. The calcar was milled to 16BL. A trial cone and stem was inserted in the 25 degrees anteversion, with a +0 36mm trial head. Trial reduction was then performed and excellent stability was noted with at 90 of flexion with 70 of internal rotation and then full extension with maximal external rotation. The hip could not be dislocated in full extension. The knee could easily flex  to about 130 degrees. We also stretched the abductors at this point,  because of the preexisting adductor contractures. All trial components  were then removed. The acetabulum was irrigated out with normal saline  solution. A titanium [facility] was then screwed into place  followed by a 10-degree polyethylene liner index superior-posterior. On  the femoral side a 16BL ZTT1 sleeve was hammered into place, followed by a 16x11x150x36 +6 SROM stem in 25 degrees of anteversion. At this point, a +0 36 mm ceramic head was  hammered on the stem. The hip was reduced. We checked our stability  one more time and found it to be excellent. The wound was once again  thoroughly irrigated out with normal saline solution pulse lavage. The  capsular flap and short external rotators were repaired back to the  intertrochanteric crest through drill holes with a #2 Ethibond suture.  The IT band was closed with running 1 Vicryl suture. The  subcutaneous  tissue with 0 and 2-0 undyed Vicryl suture and the skin with running  interlocking 3-0 nylon suture. Dressing of Xeroform and Mepilex was  then applied. The patient was then unclamped, rolled supine, awaken extubated and taken to recovery room without difficulty in stable condition.   Bertil Brickey J 07/07/2013, 9:18 AM

## 2013-07-07 NOTE — Evaluation (Signed)
Physical Therapy Evaluation Patient Details Name: ROSMARY DIONISIO MRN: [id number] DOB: 21-Mar-1956 Today's Date: 07/07/2013 Time: 7829-5621 PT Time Calculation (min): 17 min  PT Assessment / Plan / Recommendation History of Present Illness  Patient is a 57 yo female s/p Lt THA.  Clinical Impression  Patient presents with problems listed below.  Anticipate good progress with PT.  Will benefit from acute PT to maximize independence prior to discharge home.    PT Assessment  Patient needs continued PT services    Follow Up Recommendations  Home health PT;Supervision/Assistance - 24 hour    Does the patient have the potential to tolerate intense rehabilitation      Barriers to Discharge        Equipment Recommendations  Rolling walker with 5" wheels;3in1 (PT)    Recommendations for Other Services     Frequency 7X/week    Precautions / Restrictions Precautions Precautions: Posterior Hip Precaution Booklet Issued: Yes (comment) Precaution Comments: Reviewed hip precautions with patient. Restrictions Weight Bearing Restrictions: Yes LLE Weight Bearing: Weight bearing as tolerated   Pertinent Vitals/Pain Pain limiting mobility.      Mobility  Bed Mobility Bed Mobility: Supine to Sit;Sitting - Scoot to Edge of Bed Supine to Sit: 4: Min assist;HOB elevated Sitting - Scoot to Edge of Bed: 4: Min guard;With rail Details for Bed Mobility Assistance: Verbal cues for technique.  Assist to move LLE off of bed.  Patient sat EOB with good balance. Transfers Transfers: Sit to Stand;Stand to Sit Sit to Stand: 4: Min assist;With upper extremity assist;From bed Stand to Sit: 4: Min assist;With upper extremity assist;With armrests;To chair/3-in-1 Details for Transfer Assistance: Verbal cues for hand placement.  Assist to rise to standing and for balance. Ambulation/Gait Ambulation/Gait Assistance: 4: Min assist Ambulation Distance (Feet): 3 Feet Assistive device: Rolling  walker Ambulation/Gait Assistance Details: Verbal cues for safe use of RW.  Patient tending to hold LLE off of ground.  Encouraged patient to put foot on floor for more normal gait. Gait Pattern: Step-to pattern;Decreased stance time - left;Decreased step length - right;Antalgic    Exercises Total Joint Exercises Ankle Circles/Pumps: AROM;Both;10 reps;Seated   PT Diagnosis: Difficulty walking;Acute pain  PT Problem List: Decreased strength;Decreased range of motion;Decreased activity tolerance;Decreased balance;Decreased mobility;Decreased knowledge of use of DME;Decreased knowledge of precautions;Pain PT Treatment Interventions: DME instruction;Gait training;Stair training;Functional mobility training;Therapeutic exercise;Patient/family education     PT Goals(Current goals can be found in the care plan section) Acute Rehab PT Goals Patient Stated Goal: To go home PT Goal Formulation: With patient Time For Goal Achievement: 07/14/13 Potential to Achieve Goals: Good  Visit Information  Last PT Received On: 07/07/13 Assistance Needed: +1 History of Present Illness: Patient is a 57 yo female s/p Lt THA.       Prior Functioning  Home Living Family/patient expects to be discharged to:: Private residence Living Arrangements: Children;Other relatives (Son and niece) Available Help at Discharge: Family;Available 24 hours/day Type of Home: House Home Access: Stairs to enter [employer] of Steps: 4 Entrance Stairs-Rails: None Home Layout: One level Home Equipment: None Prior Function Level of Independence: Independent Communication Communication: No difficulties    Cognition  Cognition Arousal/Alertness: Awake/alert Behavior During Therapy: WFL for tasks assessed/performed Overall Cognitive Status: Within Functional Limits for tasks assessed    Extremity/Trunk Assessment Upper Extremity Assessment Upper Extremity Assessment: Overall WFL for tasks assessed Lower  Extremity Assessment Lower Extremity Assessment: LLE deficits/detail LLE Deficits / Details: Decreased ROM and strength due to surgery/pain.  Patient  able to assist with moving LLE off of bed. LLE: Unable to fully assess due to pain Cervical / Trunk Assessment Cervical / Trunk Assessment: Normal   Balance    End of Session PT - End of Session Equipment Utilized During Treatment: Gait belt Activity Tolerance: Patient limited by pain;Patient limited by fatigue Patient left: in chair;with call bell/phone within reach Nurse Communication: Mobility status  GP     Vena Austria 07/07/2013, 4:54 PM Durenda Hurt. Renaldo Fiddler, [facility] Acute Rehab Services Pager [phone]

## 2013-07-07 NOTE — Anesthesia Preprocedure Evaluation (Addendum)
Anesthesia Evaluation  Patient identified by MRN, date of birth, ID band Patient awake    Reviewed: Allergy & Precautions, H&P , NPO status , Patient's Chart, lab work & pertinent test results  Airway Mallampati: II TM Distance: >3 FB Neck ROM: Full    Dental  (+) Partial Upper, Dental Advisory Given and Poor Dentition   Pulmonary shortness of breath and with exertion, asthma (daily inhalers) , pneumonia -, resolved, Current Smoker,  breath sounds clear to auscultation  Pulmonary exam normal       Cardiovascular hypertension, Pt. on medications + Valvular Problems/Murmurs MVP Rhythm:Regular Rate:Normal     Neuro/Psych negative neurological ROS     GI/Hepatic Neg liver ROS, GERD-  Medicated and Controlled,  Endo/Other  negative endocrine ROS  Renal/GU negative Renal ROS     Musculoskeletal   Abdominal   Peds  Hematology   Anesthesia Other Findings   Reproductive/Obstetrics                         Anesthesia Physical Anesthesia Plan  ASA: II  Anesthesia Plan: General   Post-op Pain Management:    Induction: Intravenous  Airway Management Planned: Oral ETT  Additional Equipment:   Intra-op Plan:   Post-operative Plan: Extubation in OR  Informed Consent: I have reviewed the patients History and Physical, chart, labs and discussed the procedure including the risks, benefits and alternatives for the proposed anesthesia with the patient or authorized representative who has indicated his/her understanding and acceptance.   Dental advisory given  Plan Discussed with: CRNA and Surgeon  Anesthesia Plan Comments: (Plan routine monitors, GETA)        Anesthesia Quick Evaluation

## 2013-07-07 NOTE — Interval H&P Note (Signed)
History and Physical Interval Note:  07/07/2013 7:32 AM  Annette Andrade  has presented today for surgery, with the diagnosis of LEFT HIP AVASCULAR NECROSIS  The various methods of treatment have been discussed with the patient and family. After consideration of risks, benefits and other options for treatment, the patient has consented to  Procedure(s): LEFT TOTAL HIP ARTHROPLASTY (Left) as a surgical intervention .  The patient's history has been reviewed, patient examined, no change in status, stable for surgery.  I have reviewed the patient's chart and labs.  Questions were answered to the patient's satisfaction.     [name]

## 2013-07-08 ENCOUNTER — Encounter (HOSPITAL_COMMUNITY): Payer: Self-pay | Admitting: Orthopedic Surgery

## 2013-07-08 LAB — BASIC METABOLIC PANEL
BUN: 6 mg/dL (ref 6–23)
CO2: 29 mEq/L (ref 19–32)
Chloride: 96 mEq/L (ref 96–112)
Creatinine, Ser: 0.59 mg/dL (ref 0.50–1.10)

## 2013-07-08 LAB — CBC
HCT: 33.1 % — ABNORMAL LOW (ref 36.0–46.0)
MCH: 31.3 pg (ref 26.0–34.0)
MCV: 87.8 fL (ref 78.0–100.0)
RBC: 3.77 MIL/uL — ABNORMAL LOW (ref 3.87–5.11)
RDW: 14 % (ref 11.5–15.5)
WBC: 9.6 10*3/uL (ref 4.0–10.5)

## 2013-07-08 NOTE — Progress Notes (Signed)
Patient ID: Annette Andrade, female   DOB: 04/23/1956, 57 y.o.   MRN: [id number] PATIENT ID: Annette Andrade  MRN: [id number]  DOB/AGE:  Jun 17, 1956 / 57 y.o.  1 Day Post-Op Procedure(s) (LRB): LEFT TOTAL HIP ARTHROPLASTY (Left)    PROGRESS NOTE Subjective: Patient is alert, oriented,no Nausea, no Vomiting, yes passing gas, no Bowel Movement. Taking PO well. Denies SOB, Chest or Calf Pain. Using Incentive Spirometer, PAS in place. Ambulate bed to chair with physical therapy yesterday, patient is weightbearing as tolerated Patient reports pain as 7 on 0-10 scale  .    Objective: Vital signs in last 24 hours: Filed Vitals:   07/07/13 2034 07/07/13 2056 07/08/13 0219 07/08/13 0634  BP: 127/68  132/66 129/71  Pulse: 91  96 98  Temp: 98.6 F (37 C)  99.1 F (37.3 C) 98.4 F (36.9 C)  TempSrc: Oral  Oral Oral  Resp: 16  16 16   Height:      SpO2: 98% 96% 97% 98%      Intake/Output from previous day: I/O last 3 completed shifts: In: 2725.4 [P.O.:240; I.V.:2485.4] Out: 2800 [Urine:2550; Blood:250]   Intake/Output this shift:     LABORATORY DATA:  Recent Labs  07/08/13 0530  WBC 9.6  HGB 11.8*  HCT 33.1*  PLT 242    Examination: Neurologically intact ABD soft Neurovascular intact Sensation intact distally Intact pulses distally Dorsiflexion/Plantar flexion intact Incision: scant drainage No cellulitis present Compartment soft} XR AP&Lat of hip shows well placed\fixed THA  Assessment:   1 Day Post-Op Procedure(s) (LRB): LEFT TOTAL HIP ARTHROPLASTY (Left) ADDITIONAL DIAGNOSIS:  Hypertension and asthma  Plan: PT/OT WBAT, THA  posterior precautions  DVT Prophylaxis: SCDx72 hrs, ASA 325 mg BID x 2 weeks  DISCHARGE PLAN: Home, probably tomorrow since patient did not walked in hallway yesterday  DISCHARGE NEEDS: HHPT, HHRN, CPM and Walker

## 2013-07-08 NOTE — Evaluation (Signed)
Occupational Therapy Evaluation Patient Details Name: Annette Andrade MRN: [id number] DOB: 09/26/1956 Today's Date: 07/08/2013 Time: 0865-7846 OT Time Calculation (min): 23 min  OT Assessment / Plan / Recommendation History of present illness Patient is a 57 yo female s/p Lt THA.   Clinical Impression   Pt demos decline in function with ADLs and ADL mobility safety and would benefit from acute OT services to address impairments to help restore PLOF to return home safely    OT Assessment  Patient needs continued OT Services    Follow Up Recommendations  Home health OT;Supervision/Assistance - 24 hour    Barriers to Discharge   None  Equipment Recommendations  3 in 1 bedside comode    Recommendations for Other Services    Frequency  Min 2X/week    Precautions / Restrictions Precautions Precautions: Posterior Hip Precaution Comments: Pt unable to recall any hip precautions. Reviewed all hip precautions with pt Restrictions LLE Weight Bearing: Weight bearing as tolerated   Pertinent Vitals/Pain 7/10    ADL  Grooming: Performed;Wash/dry hands;Wash/dry face;Min guard Where Assessed - Grooming: Supported standing Upper Body Bathing: Simulated;Set up;Supervision/safety Lower Body Bathing: Performed;Moderate assistance;Maximal assistance Upper Body Dressing: Performed;Supervision/safety;Set up Lower Body Dressing: Performed;Maximal assistance Toilet Transfer: Performed;Minimal assistance Toilet Transfer Method: Sit to stand Toilet Transfer Equipment: Regular height toilet;Grab bars Toileting - Clothing Manipulation and Hygiene: Performed;Minimal assistance;Moderate assistance Where Assessed - Toileting Clothing Manipulation and Hygiene: Standing Tub/Shower Transfer Method: Not assessed Equipment Used: Rolling walker;Gait belt;Sock aid;Long-handled shoe horn;Long-handled sponge;Reacher Transfers/Ambulation Related to ADLs: cues for safety, correct hand placement ADL Comments:  Pt provided with education and demo of ADL A/E. DME was delivered to room at end of session    OT Diagnosis: Generalized weakness;Acute pain  OT Problem List: Decreased strength;Pain;Decreased activity tolerance;Decreased knowledge of precautions;Decreased knowledge of use of DME or AE;Impaired balance (sitting and/or standing) OT Treatment Interventions: Self-care/ADL training;Therapeutic activities;Therapeutic exercise;Neuromuscular education;Patient/family education;DME and/or AE instruction;Balance training   OT Goals(Current goals can be found in the care plan section) Acute Rehab OT Goals Patient Stated Goal: To go home OT Goal Formulation: With patient Time For Goal Achievement: 07/15/13 Potential to Achieve Goals: Good ADL Goals Pt Will Perform Grooming: with set-up;with supervision;standing Pt Will Perform Lower Body Bathing: with mod assist;with min assist;with adaptive equipment Pt Will Perform Lower Body Dressing: with min assist;with mod assist;with adaptive equipment Pt Will Transfer to Toilet: with min guard assist;with supervision;grab bars (3 in 1) Pt Will Perform Toileting - Clothing Manipulation and hygiene: with min guard assist;with min assist;sit to/from stand Pt Will Perform Tub/Shower Transfer: with min guard assist;with supervision;3 in 1;grab bars;shower seat  Visit Information  Last OT Received On: 07/08/13 Assistance Needed: +1 History of Present Illness: Patient is a 57 yo female s/p Lt THA.       Prior Functioning     Home Living Family/patient expects to be discharged to:: Private residence Living Arrangements: Children;Other relatives Available Help at Discharge: Family;Available 24 hours/day Type of Home: House Home Access: Stairs to enter [employer] of Steps: 4 Entrance Stairs-Rails: None Home Layout: One level Home Equipment: None Prior Function Level of Independence: Independent Communication Communication: No  difficulties Dominant Hand: Right         Vision/Perception Vision - History Baseline Vision: Wears glasses all the time Patient Visual Report: No change from baseline Perception Perception: Within Functional Limits   Cognition  Cognition Arousal/Alertness: Lethargic Behavior During Therapy: WFL for tasks assessed/performed Overall Cognitive Status: Within Functional Limits for tasks  assessed    Extremity/Trunk Assessment Upper Extremity Assessment Upper Extremity Assessment: Overall WFL for tasks assessed Cervical / Trunk Assessment Cervical / Trunk Assessment: Normal     Mobility Bed Mobility Bed Mobility: Not assessed Details for Bed Mobility Assistance: pt up in recliner upon entering room Transfers Sit to Stand: 4: Min assist;With upper extremity assist;From chair/3-in-1;From toilet Stand to Sit: 4: Min assist;With upper extremity assist;With armrests;To chair/3-in-1;To toilet Details for Transfer Assistance: Verbal cues for hand placement.  Assist to rise to standing and for balance.        Balance Balance Balance Assessed: Yes Dynamic Standing Balance Dynamic Standing - Balance Support: Left upper extremity supported;During functional activity Dynamic Standing - Level of Assistance: 4: Min assist   End of Session OT - End of Session Equipment Utilized During Treatment: Gait belt;Rolling walker Activity Tolerance: Patient limited by pain;Patient limited by lethargy Patient left: in chair;with call bell/phone within reach  GO     Galen Manila 07/08/2013, 12:42 PM

## 2013-07-08 NOTE — Progress Notes (Signed)
[facility] to provide home health RN, PT, OT TNT to provide RW, 3:1

## 2013-07-08 NOTE — Progress Notes (Signed)
Physical Therapy Treatment Patient Details Name: VERNADETTE STUTSMAN MRN: [id number] DOB: Mar 19, 1956 Today's Date: 07/08/2013 Time: 4098-1191 PT Time Calculation (min): 26 min  PT Assessment / Plan / Recommendation  History of Present Illness     PT Comments   Patient able to ambulate small distance but had to stop due to increased hip pain. Patient feels as if she cannot get LE straighten out. Patient returned to sitting and ice applied. Continue with current POC and progressing ambulation as patient planning to DC home probably tomorrow  Follow Up Recommendations  Home health PT;Supervision/Assistance - 24 hour     Does the patient have the potential to tolerate intense rehabilitation     Barriers to Discharge        Equipment Recommendations  Rolling walker with 5" wheels;3in1 (PT)    Recommendations for Other Services    Frequency 7X/week   Progress towards PT Goals Progress towards PT goals: Progressing toward goals  Plan Current plan remains appropriate    Precautions / Restrictions Precautions Precautions: Posterior Hip Precaution Comments: Reviewed hip precautions with patient. Restrictions Weight Bearing Restrictions: Yes LLE Weight Bearing: Weight bearing as tolerated   Pertinent Vitals/Pain 8/10 R hip pain. patient repositioned for comfort and ice applied    Mobility  Bed Mobility Bed Mobility: Not assessed Transfers Sit to Stand: 4: Min assist;With upper extremity assist;From chair/3-in-1 Stand to Sit: 4: Min assist;With upper extremity assist;With armrests;To chair/3-in-1 Details for Transfer Assistance: Verbal cues for hand placement.  Assist to rise to standing and for balance. Ambulation/Gait Ambulation/Gait Assistance: 4: Min assist Ambulation Distance (Feet): 15 Feet Assistive device: Rolling walker Ambulation/Gait Assistance Details: A for RW with turns. Cues for gait sequence and technique Gait Pattern: Step-to pattern;Decreased stance time -  left;Decreased step length - right;Antalgic Gait velocity: decreased    Exercises Total Joint Exercises Ankle Circles/Pumps: AROM;Both;10 reps;Seated Heel Slides: AAROM;10 reps;Right Hip ABduction/ADduction: AAROM;Right;10 reps Long Arc Quad: AAROM;Right;10 reps   PT Diagnosis:    PT Problem List:   PT Treatment Interventions:     PT Goals (current goals can now be found in the care plan section)    Visit Information  Last PT Received On: 07/08/13 Assistance Needed: +1    Subjective Data      Cognition  Cognition Arousal/Alertness: Awake/alert Behavior During Therapy: WFL for tasks assessed/performed Overall Cognitive Status: Within Functional Limits for tasks assessed    Balance     End of Session PT - End of Session Equipment Utilized During Treatment: Gait belt Activity Tolerance: Patient tolerated treatment well;Patient limited by pain Patient left: in chair;with call bell/phone within reach   GP     Fredrich Birks 07/08/2013, 11:05 AM 07/08/2013 Fredrich Birks PTA [phone] pager [phone] office

## 2013-07-08 NOTE — Progress Notes (Signed)
Physical Therapy Treatment Patient Details Name: Annette Andrade MRN: [id number] DOB: 03/15/1956 Today's Date: 07/08/2013 Time: 9562-1308 PT Time Calculation (min): 25 min  PT Assessment / Plan / Recommendation  History of Present Illness Patient is a 57 yo female s/p Lt THA.   PT Comments   Patient able to progress with ambulation this session. Still limited by pain. Requires increased time for all mobility. Will need to increase ambulation tolerance prior to DC home.   Follow Up Recommendations  Home health PT;Supervision/Assistance - 24 hour     Does the patient have the potential to tolerate intense rehabilitation     Barriers to Discharge        Equipment Recommendations  Rolling walker with 5" wheels;3in1 (PT)    Recommendations for Other Services    Frequency 7X/week   Progress towards PT Goals Progress towards PT goals: Progressing toward goals  Plan Current plan remains appropriate    Precautions / Restrictions Precautions Precautions: Posterior Hip Precaution Comments: Reviewed precautions with patient.  Restrictions LLE Weight Bearing: Weight bearing as tolerated   Pertinent Vitals/Pain 8/10 L LE pain. patient repositioned for comfort    Mobility  Bed Mobility Bed Mobility: Sit to Supine Supine to Sit: 4: Min assist;HOB elevated Sit to Supine: 4: Min assist Details for Bed Mobility Assistance: A with LEs in and out of bed. Requries increased time Transfers Sit to Stand: 4: Min assist;With upper extremity assist;From bed Stand to Sit: 4: Min assist;With upper extremity assist;To bed Details for Transfer Assistance: Cues for safe technique and positioning of LLE Ambulation/Gait Ambulation/Gait Assistance: 4: Min assist Ambulation Distance (Feet): 30 Feet Assistive device: Rolling walker Ambulation/Gait Assistance Details: A to advance LLE. Cues for upright posture and positioning of RW Gait Pattern: Step-to pattern;Decreased stance time - left;Decreased  step length - right;Antalgic Gait velocity: decreased    Exercises Total Joint Exercises Ankle Circles/Pumps: AROM;Both;10 reps;Seated Heel Slides: AAROM;10 reps;Right Hip ABduction/ADduction: AAROM;Right;10 reps Long Arc Quad: AAROM;Right;10 reps   PT Diagnosis:    PT Problem List:   PT Treatment Interventions:     PT Goals (current goals can now be found in the care plan section) Acute Rehab PT Goals Patient Stated Goal: To go home  Visit Information  Last PT Received On: 07/08/13 Assistance Needed: +1 History of Present Illness: Patient is a 57 yo female s/p Lt THA.    Subjective Data  Patient Stated Goal: To go home   Cognition  Cognition Arousal/Alertness: Awake/alert Behavior During Therapy: WFL for tasks assessed/performed Overall Cognitive Status: Within Functional Limits for tasks assessed    Balance  Balance Balance Assessed: Yes Dynamic Standing Balance Dynamic Standing - Balance Support: Left upper extremity supported;During functional activity Dynamic Standing - Level of Assistance: 4: Min assist  End of Session PT - End of Session Equipment Utilized During Treatment: Gait belt Activity Tolerance: Patient tolerated treatment well;Patient limited by fatigue Patient left: in bed;with call bell/phone within reach Nurse Communication: Mobility status   GP     Fredrich Birks 07/08/2013, 2:44 PM  07/08/2013 Fredrich Birks PTA [phone] pager [phone] office

## 2013-07-09 LAB — CBC
HCT: 31.6 % — ABNORMAL LOW (ref 36.0–46.0)
MCHC: 35.8 g/dL (ref 30.0–36.0)
MCV: 87.8 fL (ref 78.0–100.0)
RDW: 14.1 % (ref 11.5–15.5)

## 2013-07-09 MED ORDER — CIPROFLOXACIN HCL 500 MG PO TABS
500.0000 mg | ORAL_TABLET | Freq: Two times a day (BID) | ORAL | Status: AC
  Administered 2013-07-09: 500 mg via ORAL
  Filled 2013-07-09: qty 1

## 2013-07-09 NOTE — Plan of Care (Signed)
Problem: Consults Goal: Diagnosis- Total Joint Replacement Outcome: Completed/Met Date Met:  07/09/13 Hemiarthroplasty

## 2013-07-09 NOTE — Discharge Summary (Signed)
Patient ID: Annette Andrade MRN: [id number] DOB/AGE: Mar 04, 1956 57 y.o.  Admit date: 07/07/2013 Discharge date: 07/09/2013  Admission Diagnoses:  Principal Problem:   Avascular necrosis of bone of left hip   Discharge Diagnoses:  Same  Past Medical History  Diagnosis Date  . Asthma   . Hypertension   . Hyperlipidemia   . Scoliosis   . Insomnia   . Mitral valve prolapse   . Heart murmur   . Pneumonia     hx of  . Shortness of breath   . GERD (gastroesophageal reflux disease)   . Eczema   . Glaucoma     bilateral    Surgeries: Procedure(s): LEFT TOTAL HIP ARTHROPLASTY on 07/07/2013   Consultants:    Discharged Condition: Improved  Hospital Course: Annette Andrade is an 57 y.o. female who was admitted 07/07/2013 for operative treatment ofAvascular necrosis of bone of left hip. Patient has severe unremitting pain that affects sleep, daily activities, and work/hobbies. After pre-op clearance the patient was taken to the operating room on 07/07/2013 and underwent  Procedure(s): LEFT TOTAL HIP ARTHROPLASTY.    Patient was given perioperative antibiotics: Anti-infectives   Start     Dose/Rate Route Frequency Ordered Stop   07/09/13 0800  ciprofloxacin (CIPRO) tablet 500 mg     500 mg Oral 2 times daily 07/09/13 0744 07/09/13 1959   07/07/13 0600  ceFAZolin (ANCEF) IVPB 2 g/50 mL premix     2 g 100 mL/hr over 30 Minutes Intravenous On call to O.R. 07/06/13 1435 07/07/13 0758       Patient was given sequential compression devices, early ambulation, and chemoprophylaxis to prevent DVT.  Patient benefited maximally from hospital stay and there were no complications.    Recent vital signs: Patient Vitals for the past 24 hrs:  BP Temp Temp src Pulse Resp SpO2  07/09/13 0539 94/47 mmHg 98.5 F (36.9 C) Oral 94 16 96 %  07/08/13 2009 91/56 mmHg 98.7 F (37.1 C) Oral 107 16 97 %  07/08/13 2003 - - - - - 98 %  07/08/13 1500 97/50 mmHg 98.8 F (37.1 C) - 97 16 97 %  07/08/13  1030 - - - - - 97 %     Recent laboratory studies:  Recent Labs  07/08/13 0530 07/09/13 0505  WBC 9.6 11.6*  HGB 11.8* 11.3*  HCT 33.1* 31.6*  PLT 242 241  NA 134*  --   K 3.5  --   CL 96  --   CO2 29  --   BUN 6  --   CREATININE 0.59  --   GLUCOSE 144*  --   CALCIUM 9.4  --      Discharge Medications:     Medication List    STOP taking these medications       HYDROcodone-acetaminophen 10-325 MG per tablet  Commonly known as:  NORCO     meloxicam 15 MG tablet  Commonly known as:  MOBIC      TAKE these medications       albuterol 108 (90 BASE) MCG/ACT inhaler  Commonly known as:  PROVENTIL HFA;VENTOLIN HFA  Inhale 4 puffs into the lungs every 6 (six) hours as needed for wheezing.     aspirin EC 325 MG tablet  Take 1 tablet (325 mg total) by mouth 2 (two) times daily.     b complex vitamins tablet  Take 1 tablet by mouth daily.     beclomethasone 80 MCG/ACT inhaler  Commonly  known as:  QVAR  Inhale 2 puffs into the lungs 2 (two) times daily.     calcium-vitamin D 500-200 MG-UNIT per tablet  Take 1 tablet by mouth daily.     cyclobenzaprine 5 MG tablet  Commonly known as:  FLEXERIL  Take 5 mg by mouth 3 (three) times daily as needed for muscle spasms.     latanoprost 0.005 % ophthalmic solution  Commonly known as:  XALATAN  Place 1 drop into both eyes at bedtime.     lisinopril-hydrochlorothiazide 20-25 MG per tablet  Commonly known as:  PRINZIDE,ZESTORETIC  Take 1 tablet by mouth daily.     Melatonin 5 MG Caps  Take 5 mg by mouth at bedtime as needed (sleep).     methocarbamol 500 MG tablet  Commonly known as:  ROBAXIN  Take 1 tablet (500 mg total) by mouth 2 (two) times daily with a meal.     omeprazole 20 MG capsule  Commonly known as:  PRILOSEC  Take 20 mg by mouth daily.     oxyCODONE-acetaminophen 5-325 MG per tablet  Commonly known as:  ROXICET  Take 1 tablet by mouth every 4 (four) hours as needed for pain.     ranitidine 150 MG  tablet  Commonly known as:  ZANTAC  Take 1 tablet (150 mg total) by mouth 2 (two) times daily.     simvastatin 40 MG tablet  Commonly known as:  ZOCOR  Take 40 mg by mouth every evening.     vitamin C 1000 MG tablet  Take 1,000 mg by mouth at bedtime.        Diagnostic Studies: Dg Pelvis Portable  07/07/2013   *RADIOLOGY REPORT*  Clinical Data: Status post left hip replacement.  PORTABLE PELVIS  Comparison: Plain films of the left hip 02/27/2013.  Findings: The patient has a new left total hip arthroplasty. Distal aspect of the femoral stem is not included.  The device is located and no fracture is identified.  Avascular necrosis of the right femoral head is noted.  IMPRESSION:  1.  Left hip replacement without evidence of complication.  As above, the distal end of the femoral stem is not included on the exam. 2.  Avascular necrosis right femoral head without collapse or fragmentation.   Original Report Authenticated By: Holley Dexter, M.D.    Disposition: 07-Left Against Medical Advice      Discharge Orders   Future Orders Complete By Expires   Call MD / Call 911  As directed    Comments:     If you experience chest pain or shortness of breath, CALL 911 and be transported to the hospital emergency room.  If you develope a fever above 101 F, pus (white drainage) or increased drainage or redness at the wound, or calf pain, call your surgeon's office.   Change dressing  As directed    Comments:     You may change your dressing on day 5, then change the dressing daily with sterile 4 x 4 inch gauze dressing and paper tape.  You may clean the incision with alcohol prior to redressing   Constipation Prevention  As directed    Comments:     Drink plenty of fluids.  Prune juice may be helpful.  You may use a stool softener, such as Colace (over the counter) 100 mg twice a day.  Use MiraLax (over the counter) for constipation as needed.   Diet - low sodium heart healthy  As directed  Discharge instructions  As directed    Comments:     Follow up in office with Dr. Turner Daniels in 2 weeks.   Driving restrictions  As directed    Comments:     No driving for 2 weeks   Follow the hip precautions as taught in Physical Therapy  As directed    Increase activity slowly as tolerated  As directed    Patient may shower  As directed    Comments:     You may shower without a dressing once there is no drainage.  Do not wash over the wound.  If drainage remains, cover wound with plastic wrap and then shower.      Follow-up Information   Follow up with [name], MD In 2 weeks.   Specialty:  Orthopedic Surgery   Contact information:   [address] [place] [address] [phone]        Signed: Henry Russel 07/09/2013, 7:47 AM

## 2013-07-09 NOTE — Progress Notes (Signed)
Occupational Therapy Treatment Patient Details Name: Annette Andrade MRN: [id number] DOB: [birth year] Today's Date: 07/09/2013 Time: 4098-1191 OT Time Calculation (min): 23 min  OT Assessment / Plan / Recommendation       OT comments  Pt. States she is feeling and doing better today.  Amb. To/from b.room with min guard a, and cues for walker safety.  Still very hesitant for weight bearing through LLE.  Bed mobility very difficult for pt. But had multiple family members/friends who were present for session and state they will be with pt. At home to assist, and also assisted during tx. Session today.   Follow Up Recommendations  Home health OT;Supervision/Assistance - 24 hour           Equipment Recommendations  3 in 1 bedside comode        Frequency Min 2X/week   Progress towards OT Goals Progress towards OT goals: Progressing toward goals  Plan Discharge plan remains appropriate    Precautions / Restrictions Precautions Precautions: Posterior Hip Restrictions LLE Weight Bearing: Weight bearing as tolerated   Pertinent Vitals/Pain 5/10 without activity, pain increases with activity. meds given earlier this a.m. Assisted pt. With repositioning    ADL  Grooming: Performed;Wash/dry hands;Min guard Where Assessed - Grooming: Supported standing Lower Body Dressing: Other (comment) (friends/family present and state they will assist at d/c) Toilet Transfer: Performed;Minimal assistance Toilet Transfer Method: Sit to Barista: Regular height toilet;Grab bars Toileting - Clothing Manipulation and Hygiene: Performed;Min guard Where Assessed - Engineer, mining and Hygiene: Standing;Sit on 3-in-1 or toilet Transfers/Ambulation Related to ADLs: cues for walker safety and to bring rle equal to lle after step, pt. still hesitant for wb on lle  ADL Comments: able to recall 1/3 hip precautions, needed cues for bending and crossing legs        OT  Goals(current goals can now be found in the care plan section)    Visit Information  Last OT Received On: 07/09/13                 Cognition  Cognition Arousal/Alertness: Awake/alert Behavior During Therapy: [facility] for tasks assessed/performed Overall Cognitive Status: Within Functional Limits for tasks assessed    Mobility  Bed Mobility Details for Bed Mobility Assistance: max a x2, pt. required max a to move LLE off of bed to right, and assistance from family member to provide upper body support from supine to sitting, hob flat no rails to simulate home environment.   Transfers Transfers: Sit to Stand;Stand to Sit Sit to Stand: 4: Min assist;With upper extremity assist;From bed Stand to Sit: 4: Min guard;To toilet Details for Transfer Assistance: cues for slidding LLE forward prior to sitting down               End of Session OT - End of Session Equipment Utilized During Treatment: Rolling walker Activity Tolerance: Patient tolerated treatment well Patient left: in chair;with call bell/phone within reach;with family/visitor present       Robet Leu, COTA/L 07/09/2013, 7:42 AM

## 2013-07-09 NOTE — Progress Notes (Signed)
Physical Therapy Treatment Patient Details Name: Annette Andrade MRN: [id number] DOB: [birth year] Today's Date: 07/09/2013 Time: 4098-1191 PT Time Calculation (min): 23 min  PT Assessment / Plan / Recommendation  History of Present Illness Patient is a 57 yo female s/p Lt THA.   PT Comments   Patient able to tolerate steps this session. Still limited with ambulation. Patient does not feel safe with discharging home at this time.   Follow Up Recommendations  Home health PT;Supervision/Assistance - 24 hour     Does the patient have the potential to tolerate intense rehabilitation     Barriers to Discharge        Equipment Recommendations  Rolling walker with 5" wheels;3in1 (PT)    Recommendations for Other Services    Frequency 7X/week   Progress towards PT Goals Progress towards PT goals: Progressing toward goals  Plan Current plan remains appropriate    Precautions / Restrictions Precautions Precautions: Posterior Hip Precaution Comments: Reviewed precautions with patient.  Restrictions LLE Weight Bearing: Weight bearing as tolerated   Pertinent Vitals/Pain 8/10 L leg pain. patient repositioned for comfort      Mobility  Bed Mobility Bed Mobility: Not assessed Details for Bed Mobility Assistance: max a x2, pt. required max a to move LLE off of bed to right, and assistance from family member to provide upper body support from supine to sitting, hob flat no rails to simulate home environment.   Transfers Sit to Stand: With upper extremity assist;4: Min guard;From chair/3-in-1 Stand to Sit: 4: Min guard;To toilet;To chair/3-in-1 Details for Transfer Assistance: cues for slidding LLE forward prior to sitting down Ambulation/Gait Ambulation/Gait Assistance: 4: Min guard Ambulation Distance (Feet): 30 Feet Assistive device: Rolling walker Ambulation/Gait Assistance Details: Patient able to advance R LE Gait Pattern: Step-to pattern;Decreased stance time - left;Decreased  step length - right;Antalgic Gait velocity: decreased Stairs: Yes Stairs Assistance: 4: Min assist Stairs Assistance Details (indicate cue type and reason): A for RW. Cues for sequency and technique  Stair Management Technique: No rails;Step to pattern;Backwards;With walker Number of Stairs: 2    Exercises Total Joint Exercises Quad Sets: AROM;Both;10 reps Gluteal Sets: AROM;Both;10 reps Heel Slides: AAROM;10 reps;Right Hip ABduction/ADduction: AAROM;Right;10 reps Long Arc Quad: AAROM;Right;10 reps   PT Diagnosis:    PT Problem List:   PT Treatment Interventions:     PT Goals (current goals can now be found in the care plan section)    Visit Information  Last PT Received On: 07/09/13 Assistance Needed: +1 History of Present Illness: Patient is a 57 yo female s/p Lt THA.    Subjective Data      Cognition  Cognition Arousal/Alertness: Awake/alert Behavior During Therapy: WFL for tasks assessed/performed Overall Cognitive Status: Within Functional Limits for tasks assessed    Balance     End of Session PT - End of Session Equipment Utilized During Treatment: Gait belt Activity Tolerance: Patient tolerated treatment well;Patient limited by pain Patient left: in bed;with call bell/phone within reach Nurse Communication: Mobility status   GP     Fredrich Birks 07/09/2013, 10:01 AM 07/09/2013 Fredrich Birks PTA [phone] pager [phone] office

## 2013-07-09 NOTE — Progress Notes (Signed)
Discharge instructions given. Pt verbalized understanding and all questions were answered.  

## 2013-07-09 NOTE — Progress Notes (Signed)
PATIENT ID: Annette Andrade  MRN: [id number]  DOB/AGE:  03/15/1956 / 57 y.o.  2 Days Post-Op Procedure(s) (LRB): LEFT TOTAL HIP ARTHROPLASTY (Left)    PROGRESS NOTE Subjective: Patient is alert, oriented,no Nausea, no Vomiting, yes passing gas, no Bowel Movement. Taking PO well, pt up and eating in room. Denies SOB, Chest or Calf Pain. Using Incentive Spirometer, PAS in place. Ambulate WBAT Patient reports pain as moderate  .    Objective: Vital signs in last 24 hours: Filed Vitals:   07/08/13 1500 07/08/13 2003 07/08/13 2009 07/09/13 0539  BP: 97/50  91/56 94/47  Pulse: 97  107 94  Temp: 98.8 F (37.1 C)  98.7 F (37.1 C) 98.5 F (36.9 C)  TempSrc:   Oral Oral  Resp: 16  16 16   Height:      SpO2: 97% 98% 97% 96%      Intake/Output from previous day: I/O last 3 completed shifts: In: 1895.4 [P.O.:960; I.V.:935.4] Out: 2100 [Urine:2100]   Intake/Output this shift:     LABORATORY DATA:  Recent Labs  07/08/13 0530 07/09/13 0505  WBC 9.6 11.6*  HGB 11.8* 11.3*  HCT 33.1* 31.6*  PLT 242 241  NA 134*  --   K 3.5  --   CL 96  --   CO2 29  --   BUN 6  --   CREATININE 0.59  --   GLUCOSE 144*  --   CALCIUM 9.4  --     Examination: Neurologically intact ABD soft Neurovascular intact Intact pulses distally Dorsiflexion/Plantar flexion intact Incision: scant drainage No cellulitis present} XR AP&Lat of hip shows well placed\fixed THA  Assessment:   2 Days Post-Op Procedure(s) (LRB): LEFT TOTAL HIP ARTHROPLASTY (Left) ADDITIONAL DIAGNOSIS:  Hypertension and Asthma  Plan: PT/OT WBAT, THA  posterior precautions  DVT Prophylaxis: SCDx72 hrs, ASA 325 mg BID x 2 weeks  DISCHARGE PLAN: Home, when pt passes pt.  DISCHARGE NEEDS: HHPT, HHRN, Walker and 3-in-1 comode seat

## 2013-08-04 ENCOUNTER — Ambulatory Visit: Payer: Medicaid Other | Attending: Orthopedic Surgery

## 2013-08-04 DIAGNOSIS — M25559 Pain in unspecified hip: Secondary | ICD-10-CM | POA: Insufficient documentation

## 2013-08-04 DIAGNOSIS — Z96649 Presence of unspecified artificial hip joint: Secondary | ICD-10-CM | POA: Insufficient documentation

## 2013-08-04 DIAGNOSIS — IMO0001 Reserved for inherently not codable concepts without codable children: Secondary | ICD-10-CM | POA: Insufficient documentation

## 2013-08-04 DIAGNOSIS — R262 Difficulty in walking, not elsewhere classified: Secondary | ICD-10-CM | POA: Insufficient documentation

## 2013-08-04 DIAGNOSIS — M25659 Stiffness of unspecified hip, not elsewhere classified: Secondary | ICD-10-CM | POA: Insufficient documentation

## 2013-10-16 ENCOUNTER — Emergency Department (HOSPITAL_COMMUNITY)
Admission: EM | Admit: 2013-10-16 | Discharge: 2013-10-16 | Disposition: A | Discharge status: Home / Self Care | Payer: Medicaid Other | Attending: Emergency Medicine | Admitting: Emergency Medicine

## 2013-10-16 ENCOUNTER — Encounter (HOSPITAL_COMMUNITY): Payer: Self-pay | Admitting: Emergency Medicine

## 2013-10-16 ENCOUNTER — Emergency Department (HOSPITAL_COMMUNITY): Payer: Medicaid Other

## 2013-10-16 DIAGNOSIS — R011 Cardiac murmur, unspecified: Secondary | ICD-10-CM | POA: Insufficient documentation

## 2013-10-16 DIAGNOSIS — J111 Influenza due to unidentified influenza virus with other respiratory manifestations: Secondary | ICD-10-CM | POA: Insufficient documentation

## 2013-10-16 DIAGNOSIS — Z79899 Other long term (current) drug therapy: Secondary | ICD-10-CM | POA: Insufficient documentation

## 2013-10-16 DIAGNOSIS — J40 Bronchitis, not specified as acute or chronic: Secondary | ICD-10-CM

## 2013-10-16 DIAGNOSIS — R5381 Other malaise: Secondary | ICD-10-CM | POA: Insufficient documentation

## 2013-10-16 DIAGNOSIS — Z8701 Personal history of pneumonia (recurrent): Secondary | ICD-10-CM | POA: Insufficient documentation

## 2013-10-16 DIAGNOSIS — R0789 Other chest pain: Secondary | ICD-10-CM | POA: Insufficient documentation

## 2013-10-16 DIAGNOSIS — F17200 Nicotine dependence, unspecified, uncomplicated: Secondary | ICD-10-CM | POA: Insufficient documentation

## 2013-10-16 DIAGNOSIS — Z872 Personal history of diseases of the skin and subcutaneous tissue: Secondary | ICD-10-CM | POA: Insufficient documentation

## 2013-10-16 DIAGNOSIS — IMO0002 Reserved for concepts with insufficient information to code with codable children: Secondary | ICD-10-CM | POA: Insufficient documentation

## 2013-10-16 DIAGNOSIS — I10 Essential (primary) hypertension: Secondary | ICD-10-CM | POA: Insufficient documentation

## 2013-10-16 DIAGNOSIS — J45901 Unspecified asthma with (acute) exacerbation: Secondary | ICD-10-CM | POA: Insufficient documentation

## 2013-10-16 DIAGNOSIS — K219 Gastro-esophageal reflux disease without esophagitis: Secondary | ICD-10-CM | POA: Insufficient documentation

## 2013-10-16 DIAGNOSIS — J029 Acute pharyngitis, unspecified: Secondary | ICD-10-CM | POA: Insufficient documentation

## 2013-10-16 DIAGNOSIS — H409 Unspecified glaucoma: Secondary | ICD-10-CM | POA: Insufficient documentation

## 2013-10-16 DIAGNOSIS — G4700 Insomnia, unspecified: Secondary | ICD-10-CM | POA: Insufficient documentation

## 2013-10-16 DIAGNOSIS — R509 Fever, unspecified: Secondary | ICD-10-CM | POA: Insufficient documentation

## 2013-10-16 DIAGNOSIS — R6889 Other general symptoms and signs: Secondary | ICD-10-CM

## 2013-10-16 DIAGNOSIS — E785 Hyperlipidemia, unspecified: Secondary | ICD-10-CM | POA: Insufficient documentation

## 2013-10-16 DIAGNOSIS — Z8739 Personal history of other diseases of the musculoskeletal system and connective tissue: Secondary | ICD-10-CM | POA: Insufficient documentation

## 2013-10-16 LAB — CBC
HCT: 35.5 % — ABNORMAL LOW (ref 36.0–46.0)
Hemoglobin: 12.3 g/dL (ref 12.0–15.0)
MCV: 87.7 fL (ref 78.0–100.0)
RDW: 14.2 % (ref 11.5–15.5)
WBC: 5.5 10*3/uL (ref 4.0–10.5)

## 2013-10-16 LAB — POCT I-STAT TROPONIN I: Troponin i, poc: 0.01 ng/mL (ref 0.00–0.08)

## 2013-10-16 LAB — COMPREHENSIVE METABOLIC PANEL
Albumin: 3.3 g/dL — ABNORMAL LOW (ref 3.5–5.2)
BUN: 6 mg/dL (ref 6–23)
Chloride: 106 mEq/L (ref 96–112)
Creatinine, Ser: 0.6 mg/dL (ref 0.50–1.10)
GFR calc Af Amer: >90 mL/min (ref >90)
GFR calc non Af Amer: >90 mL/min (ref >90)
Glucose, Bld: 122 mg/dL — ABNORMAL HIGH (ref 70–99)
Total Bilirubin: 0.1 mg/dL — ABNORMAL LOW (ref 0.3–1.2)

## 2013-10-16 LAB — CG4 I-STAT (LACTIC ACID): Lactic Acid, Venous: 1.76 mmol/L (ref 0.5–2.2)

## 2013-10-16 MED ORDER — PREDNISONE 20 MG PO TABS
60.0000 mg | ORAL_TABLET | Freq: Once | ORAL | Status: AC
  Administered 2013-10-16: 60 mg via ORAL
  Filled 2013-10-16: qty 3

## 2013-10-16 MED ORDER — OSELTAMIVIR PHOSPHATE 75 MG PO CAPS
75.0000 mg | ORAL_CAPSULE | Freq: Once | ORAL | Status: AC
  Administered 2013-10-16: 75 mg via ORAL
  Filled 2013-10-16 (×2): qty 1

## 2013-10-16 MED ORDER — GUAIFENESIN-DM 100-10 MG/5ML PO SYRP: 5.0000 mL | ORAL_SOLUTION | ORAL | Status: AC | PRN

## 2013-10-16 MED ORDER — ALBUTEROL SULFATE (5 MG/ML) 0.5% IN NEBU
5.0000 mg | INHALATION_SOLUTION | Freq: Once | RESPIRATORY_TRACT | Status: AC
  Administered 2013-10-16: 5 mg via RESPIRATORY_TRACT
  Filled 2013-10-16: qty 1

## 2013-10-16 MED ORDER — OSELTAMIVIR PHOSPHATE 75 MG PO CAPS: 75.0000 mg | ORAL_CAPSULE | Freq: Two times a day (BID) | ORAL | Status: AC

## 2013-10-16 MED ORDER — PREDNISONE 10 MG PO TABS: ORAL_TABLET | ORAL | Status: AC

## 2013-10-16 MED ORDER — SODIUM CHLORIDE 0.9 % IV BOLUS (SEPSIS)
1000.0000 mL | Freq: Once | INTRAVENOUS | Status: AC
  Administered 2013-10-16: 1000 mL via INTRAVENOUS

## 2013-10-16 MED ORDER — IPRATROPIUM BROMIDE 0.02 % IN SOLN
0.5000 mg | Freq: Once | RESPIRATORY_TRACT | Status: AC
  Administered 2013-10-16: 0.5 mg via RESPIRATORY_TRACT
  Filled 2013-10-16: qty 2.5

## 2013-10-16 MED ORDER — ACETAMINOPHEN 325 MG PO TABS
650.0000 mg | ORAL_TABLET | Freq: Once | ORAL | Status: AC
  Administered 2013-10-16: 650 mg via ORAL
  Filled 2013-10-16: qty 2

## 2013-10-16 MED ORDER — HYDROCOD POLST-CHLORPHEN POLST 10-8 MG/5ML PO LQCR
5.0000 mL | Freq: Once | ORAL | Status: AC
  Administered 2013-10-16: 5 mL via ORAL
  Filled 2013-10-16: qty 5

## 2013-10-16 NOTE — ED Notes (Signed)
Patient is alert and orientedx4.  Patient was explained discharge instructions and they understood them with no questions.  Michelle Piper, the patient's son is taking the patient home.

## 2013-10-16 NOTE — ED Notes (Signed)
Family at bedside. 

## 2013-10-16 NOTE — ED Notes (Signed)
Patient presents today with a chief complaint of chest congestion/pain, cough, fever, and shortness of breath x 2 days. Patient currently febrile, reports chest pain 8/10.

## 2013-10-16 NOTE — ED Provider Notes (Signed)
CSN: [id number]     Arrival date & time 10/16/13  1549 History   First MD Initiated Contact with Patient 10/16/13 1601     Chief Complaint  Patient presents with  . Cough  . Chest Pain   (Consider location/radiation/quality/duration/timing/severity/associated sxs/prior Treatment) HPI Annette Andrade is a 57 y.o. female who presents to emergency department complaining of cough, congestion, sore throat, body aches, fever. Patient states her symptoms began 2 days ago. States that she has been taking cough syrup and ibuprofen for her symptoms. Last dose of ibuprofen taken this morning. She states that she has pain in her upper abdomen and chest with coughing. She reports shortness of breath. She denies having any ill contacts recently. She states she did get her flu shot 10 days ago. She does admit to smoking and having history of asthma. She's been taking her albuterol inhaler and Qvar regularly with no improvement in her symptoms. She denies any headache or neck stiffness. She denies any nausea, vomiting, diarrhea. She denies any urinary symptoms.  Past Medical History  Diagnosis Date  . Asthma   . Hypertension   . Hyperlipidemia   . Scoliosis   . Insomnia   . Mitral valve prolapse   . Heart murmur   . Pneumonia     hx of  . Shortness of breath   . GERD (gastroesophageal reflux disease)   . Eczema   . Glaucoma     bilateral   Past Surgical History  Procedure Laterality Date  . Finger surgery  57 years old    does not remember why, also has scar on side of abdomen  . Anal fissure repair    . 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 Colon cancer Paternal Uncle   . Throat cancer Maternal Aunt   . Stroke Mother   . Heart attack Father    History  Substance Use Topics  . Smoking status: Current Every  Day Smoker -- 0.50 packs/day for 40 years    Types: Cigarettes  . Smokeless tobacco: Not on file  . Alcohol Use: Yes     Comment: rarely   OB History   Grav Para Term Preterm Abortions TAB SAB Ect Mult Living                 Review of Systems  Constitutional: Positive for fever, chills and fatigue.  Respiratory: Positive for cough, chest tightness, shortness of breath and wheezing.   Cardiovascular: Positive for chest pain. Negative for palpitations and leg swelling.  Gastrointestinal: Negative for nausea, vomiting, abdominal pain and diarrhea.  Genitourinary: Negative for dysuria, flank pain and pelvic pain.  Musculoskeletal: Positive for arthralgias and myalgias. Negative for neck pain and neck stiffness.  Skin: Negative for rash.  Neurological: Positive for weakness. Negative for dizziness and headaches.  All other systems reviewed and are negative.    Allergies  Review of patient's allergies indicates no known allergies.  Home Medications   Current Outpatient Rx  Name  Route  Sig  Dispense  Refill  . albuterol (PROVENTIL HFA;VENTOLIN HFA) 108 (90 BASE) MCG/ACT inhaler   Inhalation   Inhale 4 puffs into the lungs every 6 (six) hours as needed for wheezing.         . beclomethasone (QVAR) 80 MCG/ACT inhaler  Inhalation   Inhale 2 puffs into the lungs 2 (two) times daily.         . cyclobenzaprine (FLEXERIL) 5 MG tablet   Oral   Take 5 mg by mouth 3 (three) times daily as needed for muscle spasms.         Marland Kitchen HYDROcodone-acetaminophen (NORCO/VICODIN) 5-325 MG per tablet   Oral   Take 1 tablet by mouth every 6 (six) hours as needed for moderate pain.         Marland Kitchen latanoprost (XALATAN) 0.005 % ophthalmic solution   Both Eyes   Place 1 drop into both eyes at bedtime.         Marland Kitchen lisinopril-hydrochlorothiazide (PRINZIDE,ZESTORETIC) 20-25 MG per tablet   Oral   Take 1 tablet by mouth daily.         Marland Kitchen omeprazole (PRILOSEC) 20 MG capsule   Oral   Take 20 mg by  mouth daily.         . ranitidine (ZANTAC) 150 MG tablet   Oral   Take 1 tablet (150 mg total) by mouth 2 (two) times daily.   60 tablet   2   . simvastatin (ZOCOR) 40 MG tablet   Oral   Take 40 mg by mouth every evening.         . Ascorbic Acid (VITAMIN C) 1000 MG tablet   Oral   Take 1,000 mg by mouth at bedtime.          Marland Kitchen b complex vitamins tablet   Oral   Take 1 tablet by mouth daily.         . Calcium Carbonate-Vitamin D (CALCIUM-VITAMIN D) 500-200 MG-UNIT per tablet   Oral   Take 1 tablet by mouth daily.   30 tablet   11   . Melatonin 5 MG CAPS   Oral   Take 5 mg by mouth at bedtime as needed (sleep).           BP 145/69  Pulse 115  Temp(Src) 100.6 F (38.1 C) (Oral)  SpO2 97% Physical Exam  Nursing note and vitals reviewed. Constitutional: She is oriented to person, place, and time. She appears well-developed and well-nourished. No distress.  HENT:  Head: Normocephalic.  Right Ear: External ear and ear canal normal.  Left Ear: Tympanic membrane, external ear and ear canal normal.  Nose: Rhinorrhea present.  Mouth/Throat: Uvula is midline and oropharynx is clear and moist.  Eyes: Conjunctivae are normal.  Neck: Neck supple.  Cardiovascular: Normal rate, regular rhythm and normal heart sounds.   Pulmonary/Chest: Effort normal. No respiratory distress. She has wheezes. She has no rales.  End expiratory wheezes bilaterally  Abdominal: Soft. Bowel sounds are normal. She exhibits no distension. There is no tenderness. There is no rebound.  Musculoskeletal: She exhibits no edema.  Lymphadenopathy:    She has no cervical adenopathy.  Neurological: She is alert and oriented to person, place, and time. No cranial nerve deficit.  Skin: Skin is warm and dry.  Psychiatric: She has a normal mood and affect. Her behavior is normal.    ED Course  Procedures (including critical care time) Labs Review Labs Reviewed  CBC - Abnormal; Notable for the  following:    HCT 35.5 (*)    All other components within normal limits  COMPREHENSIVE METABOLIC PANEL - Abnormal; Notable for the following:    Potassium 3.4 (*)    Glucose, Bld 122 (*)    Albumin 3.3 (*)  AST 38 (*)    Total Bilirubin 0.1 (*)    All other components within normal limits  CG4 I-STAT (LACTIC ACID)  POCT I-STAT TROPONIN I   Imaging Review Dg Chest 2 View  10/16/2013   CLINICAL DATA:  Cough and chest pain for 2 days.  EXAM: CHEST  2 VIEW  COMPARISON:  10/19/2012.  FINDINGS: Cardiopericardial silhouette within normal limits. Mediastinal contours normal. Trachea midline. No airspace disease or effusion. Monitoring leads project over the chest. Chronic bronchitic changes are present. Low volume lateral view with basilar atelectasis.  IMPRESSION: No acute cardiopulmonary disease.  Chronic bronchitic changes.   Electronically Signed   By: Andreas Newport M.D.   On: 10/16/2013 17:14    EKG Interpretation    Date/Time:  Thursday October 16 2013 15:53:21 EST Ventricular Rate:  103 PR Interval:  166 QRS Duration: 92 QT Interval:  310 QTC Calculation: 406 R Axis:   24 Text Interpretation:  Sinus tachycardia Incomplete right bundle branch block Cannot rule out Anterior infarct , age undetermined Abnormal ECG Since last tracing rate faster Confirmed by KNAPP  MD-J, JON ([id number]) on 10/16/2013 4:11:17 PM            MDM   1. Flu-like symptoms   2. Bronchitis     Patient with flulike symptoms for 2 days. She is febrile today with temperature 100.6, tachycardia, coughing, congestion. She had mild wheezing on exam otherwise unremarkable examination. I will give her IV fluids, Tussionex for cough, neb treatments, prednisone. First dose of Tamiflu given. Labs and chest x-ray ordered.   5:44 PM Patient hydrated, heart rate in the low 100. She's feeling better after the neb and cough medication. Her labs are unremarkable. EKG unremarkable as well. Troponin negative. Lactic  acid normal. Chest x-ray is normal. I believe that patient most likely has a viral illness possibly influenza. I will start her on Tamiflu, prednisone taper at home, cough medication. Followup with primary care Dr. At this time vital signs are stable and patient is nontoxic. She is safe for discharge home.  Filed Vitals:   10/16/13 1558 10/16/13 1639  BP: 145/69   Pulse: 115 99  Temp: 100.6 F (38.1 C)   TempSrc: Oral   Resp:  15  SpO2: 97%       Sahir Tolson A Amar Sippel, PA-C 10/16/13 2350

## 2013-10-20 NOTE — ED Provider Notes (Signed)
Medical screening examination/treatment/procedure(s) were performed by non-physician practitioner and as supervising physician I was immediately available for consultation/collaboration.  EKG Interpretation    Date/Time:  Thursday October 16 2013 15:53:21 EST Ventricular Rate:  103 PR Interval:  166 QRS Duration: 92 QT Interval:  310 QTC Calculation: 406 R Axis:   24 Text Interpretation:  Sinus tachycardia Incomplete right bundle branch block Cannot rule out Anterior infarct , age undetermined Abnormal ECG Since last tracing rate faster Confirmed by Shraga Custard  MD-J, Euna Armon ([id number]) on 10/16/2013 4:11:17 PM              Celene Kras, MD 10/20/13 1606

## 2014-03-07 ENCOUNTER — Encounter (HOSPITAL_COMMUNITY): Payer: Self-pay | Admitting: Emergency Medicine

## 2014-03-07 ENCOUNTER — Other Ambulatory Visit (HOSPITAL_COMMUNITY)
Admission: RE | Admit: 2014-03-07 | Discharge: 2014-03-07 | Disposition: A | Discharge status: Home / Self Care | Payer: Medicaid Other | Source: Ambulatory Visit | Attending: Emergency Medicine | Admitting: Emergency Medicine

## 2014-03-07 ENCOUNTER — Emergency Department (HOSPITAL_COMMUNITY)
Admission: EM | Admit: 2014-03-07 | Discharge: 2014-03-07 | Disposition: A | Discharge status: Home / Self Care | Payer: Medicaid Other | Source: Home / Self Care | Attending: Emergency Medicine | Admitting: Emergency Medicine

## 2014-03-07 DIAGNOSIS — Z113 Encounter for screening for infections with a predominantly sexual mode of transmission: Secondary | ICD-10-CM | POA: Insufficient documentation

## 2014-03-07 DIAGNOSIS — B9689 Other specified bacterial agents as the cause of diseases classified elsewhere: Secondary | ICD-10-CM

## 2014-03-07 DIAGNOSIS — A499 Bacterial infection, unspecified: Secondary | ICD-10-CM

## 2014-03-07 DIAGNOSIS — N760 Acute vaginitis: Secondary | ICD-10-CM | POA: Insufficient documentation

## 2014-03-07 MED ORDER — METRONIDAZOLE 500 MG PO TABS: 500.0000 mg | ORAL_TABLET | Freq: Two times a day (BID) | ORAL | Status: AC

## 2014-03-07 NOTE — ED Notes (Signed)
Patient c/o poss yeast infection x 3-4 days. Pt has sx including discharge, and itching. Pt denies fever or bleeding. Patient is alert and oriented and in no acute distress.

## 2014-03-07 NOTE — Discharge Instructions (Signed)
To restore the normal balance of "good bacteria" in your system.  Take a probiotic once daily.  These can be gotten over the counter at the drug store without a prescription and come under various brand names such as Culturelle, Align, Florastore, and Phillips.  The best thing to do is to ask your pharmacist to recommend a good probiotic that is not too expensive.  ° ° °Bacterial Vaginosis °Bacterial vaginosis is a vaginal infection that occurs when the normal balance of bacteria in the vagina is disrupted. It results from an overgrowth of certain bacteria. This is the most common vaginal infection in women of childbearing age. Treatment is important to prevent complications, especially in pregnant women, as it can cause a premature delivery. °CAUSES  °Bacterial vaginosis is caused by an increase in harmful bacteria that are normally present in smaller amounts in the vagina. Several different kinds of bacteria can cause bacterial vaginosis. However, the reason that the condition develops is not fully understood. °RISK FACTORS °Certain activities or behaviors can put you at an increased risk of developing bacterial vaginosis, including: °· Having a new sex partner or multiple sex partners. °· Douching. °· Using an intrauterine device (IUD) for contraception. °Women do not get bacterial vaginosis from toilet seats, bedding, swimming pools, or contact with objects around them. °SIGNS AND SYMPTOMS  °Some women with bacterial vaginosis have no signs or symptoms. Common symptoms include: °· Grey vaginal discharge. °· A fishlike odor with discharge, especially after sexual intercourse. °· Itching or burning of the vagina and vulva. °· Burning or pain with urination. °DIAGNOSIS  °Your health care provider will take a medical history and examine the vagina for signs of bacterial vaginosis. A sample of vaginal fluid may be taken. Your health care provider will look at this sample under a microscope to check for bacteria and  abnormal cells. A vaginal pH test may also be done.  °TREATMENT  °Bacterial vaginosis may be treated with antibiotic medicines. These may be given in the form of a pill or a vaginal cream. A second round of antibiotics may be prescribed if the condition comes back after treatment.  °HOME CARE INSTRUCTIONS  °· Only take over-the-counter or prescription medicines as directed by your health care provider. °· If antibiotic medicine was prescribed, take it as directed. Make sure you finish it even if you start to feel better. °· Do not have sex until treatment is completed. °· Tell all sexual partners that you have a vaginal infection. They should see their health care provider and be treated if they have problems, such as a mild rash or itching. °· Practice safe sex by using condoms and only having one sex partner. °SEEK MEDICAL CARE IF:  °· Your symptoms are not improving after 3 days of treatment. °· You have increased discharge or pain. °· You have a fever. °MAKE SURE YOU:  °· Understand these instructions. °· Will watch your condition. °· Will get help right away if you are not doing well or get worse. °FOR MORE INFORMATION  °Centers for Disease Control and Prevention, Division of STD Prevention: www.cdc.gov/std °American Sexual Health Association (ASHA): www.ashastd.org  °Document Released: 10/09/2005 Document Revised: 07/30/2013 Document Reviewed: 05/21/2013 °Exit[employer]® Patient Information ©2014 [employer]. ° °

## 2014-03-07 NOTE — ED Provider Notes (Signed)
  Chief Complaint   Chief Complaint  Patient presents with  . Vaginitis    History of Present Illness   Annette Andrade is a 58 year old female who has had a three-day history of vaginal discharge, itching, and older. She denies any vulvar, vaginal, or pelvic pain. No lower back pain. No urinary symptoms. She's had no fever, chills, nausea, or vomiting. No prior history of vaginitis or STDs.  Review of Systems   Other than as noted above, the patient denies any of the following symptoms: Systemic:  No fever or chills GI:  No abdominal pain, nausea, vomiting, diarrhea, constipation, melena or hematochezia. GU:  No dysuria, frequency, urgency, hematuria, vaginal discharge, itching, or abnormal vaginal bleeding.  PMFSH   Past medical history, family history, social history, meds, and allergies were reviewed.  Current meds include simvastatin, lisinopril, Prilosec, and Zantac. She has high blood pressure, hypercholesterolemia, and reflux.  Physical Examination    Vital signs:  BP 148/73  Pulse 88  Temp(Src) 98.2 F (36.8 C) (Oral)  Resp 16  SpO2 98% General:  Alert, oriented and in no distress. Lungs:  Breath sounds clear and equal bilaterally.  No wheezes, rales or rhonchi. Heart:  Regular rhythm.  No gallops or murmers. Abdomen:  Soft, flat and non-distended.  No organomegaly or mass.  No tenderness, guarding or rebound.  Bowel sounds normally active. Pelvic exam:  Normal external genitalia. Vaginal and cervical mucosa were atrophic there was a thick, yellowish-green, malodorous discharge. No pain on cervical motion. Uterus was normal in size and shape and nontender. No adnexal tenderness or mass.  DNA probes for gonorrhea, Chlamydia, Trichomonas, Gardnerella, Candida were obtained. Skin:  Clear, warm and dry.   Assessment   The encounter diagnosis was Bacterial vaginosis.       Plan    1.  Meds:  The following meds were prescribed:   Discharge Medication List as of  03/07/2014  5:27 PM    START taking these medications   Details  metroNIDAZOLE (FLAGYL) 500 MG tablet Take 1 tablet (500 mg total) by mouth 2 (two) times daily., Starting 03/07/2014, Until Discontinued, Normal        2.  Patient Education/Counseling:  The patient was given appropriate handouts, self care instructions, and instructed in symptomatic relief.    3.  Follow up:  The patient was told to follow up here if no better in 3 to 4 days, or sooner if becoming worse in any way, and given some red flag symptoms such as worsening pain, fever, persistent vomiting, or heavy vaginal bleeding which would prompt immediate return.       Reuben Likesavid C Thane Age, MD 03/07/14 [phone]

## 2014-03-11 ENCOUNTER — Telehealth (HOSPITAL_COMMUNITY): Payer: Self-pay | Admitting: *Deleted

## 2014-03-11 NOTE — ED Notes (Signed)
GC/Chlamydia neg., Affirm: Candida and Gardnerella neg., Trich pos.  Pt. adequately treated with Flagyl.  I called pt. Pt. verified x 2 and given results.  Pt. told to finish all of her medication, to notify her partner to be treated with Flagyl, no sex until she has finished her medication and her partner has been treated and to practice safe sex. Pt. told [facility], by appointment.  Pt. said she has not had sex in awhile. I explained that it can go dormant and flare back up.  Pt.'s questions answered. Annette Andrade 03/11/2014

## 2014-10-02 ENCOUNTER — Ambulatory Visit: Payer: Medicaid Other | Attending: Internal Medicine

## 2015-01-20 ENCOUNTER — Encounter: Payer: 59 | Admitting: Neurology

## 2015-01-25 ENCOUNTER — Ambulatory Visit (INDEPENDENT_AMBULATORY_CARE_PROVIDER_SITE_OTHER): Payer: Self-pay | Admitting: Neurology

## 2015-01-25 ENCOUNTER — Encounter: Payer: Self-pay | Admitting: Neurology

## 2015-01-25 ENCOUNTER — Ambulatory Visit (INDEPENDENT_AMBULATORY_CARE_PROVIDER_SITE_OTHER): Payer: 59 | Admitting: Neurology

## 2015-01-25 DIAGNOSIS — M5126 Other intervertebral disc displacement, lumbar region: Secondary | ICD-10-CM

## 2015-01-25 DIAGNOSIS — M5442 Lumbago with sciatica, left side: Secondary | ICD-10-CM

## 2015-01-25 DIAGNOSIS — M87052 Idiopathic aseptic necrosis of left femur: Secondary | ICD-10-CM

## 2015-01-25 DIAGNOSIS — M5440 Lumbago with sciatica, unspecified side: Secondary | ICD-10-CM

## 2015-01-25 DIAGNOSIS — M5441 Lumbago with sciatica, right side: Secondary | ICD-10-CM

## 2015-01-25 NOTE — Procedures (Signed)
     HISTORY:  Annette LarveDorene Karges is a 59 year old patient with a history of back pain, and left greater than right lower extremity discomfort. The patient is being evaluated for possible lumbosacral radiculopathy. The patient has had discomfort for over 2 years. She has a history of avascular necrosis of the left hip, status post surgery.  NERVE CONDUCTION STUDIES:  Nerve conduction studies were performed on both lower extremities. The distal motor latencies and motor amplitudes for the peroneal and posterior tibial nerves were within normal limits. The nerve conduction velocities for these nerves were also normal. The H reflex latencies were normal. The sensory latencies for the peroneal nerves were within normal limits.   EMG STUDIES:  EMG study was performed on the left lower extremity:  The tibialis anterior muscle reveals 2 to 4K motor units with full recruitment. No fibrillations or positive waves were seen. The peroneus tertius muscle reveals 2 to 4K motor units with full recruitment. No fibrillations or positive waves were seen. The medial gastrocnemius muscle reveals 1 to 3K motor units with full recruitment. No fibrillations or positive waves were seen. The vastus lateralis muscle reveals 2 to 4K motor units with full recruitment. No fibrillations or positive waves were seen. The iliopsoas muscle reveals 2 to 4K motor units with full recruitment. No fibrillations or positive waves were seen. The biceps femoris muscle (long head) reveals 2 to 4K motor units with full recruitment. No fibrillations or positive waves were seen. The lumbosacral paraspinal muscles were tested at 3 levels, and revealed no abnormalities of insertional activity at all 3 levels tested. There was good relaxation.  EMG study was performed on the right lower extremity:  The tibialis anterior muscle reveals 2 to 4K motor units with full recruitment. No fibrillations or positive waves were seen. The peroneus  tertius muscle reveals 2 to 4K motor units with full recruitment. No fibrillations or positive waves were seen. The medial gastrocnemius muscle reveals 1 to 3K motor units with full recruitment. No fibrillations or positive waves were seen. The vastus lateralis muscle reveals 2 to 4K motor units with full recruitment. No fibrillations or positive waves were seen. The iliopsoas muscle reveals 2 to 4K motor units with full recruitment. No fibrillations or positive waves were seen. The biceps femoris muscle (long head) reveals 2 to 4K motor units with full recruitment. No fibrillations or positive waves were seen. The lumbosacral paraspinal muscles were tested at 3 levels, and revealed no abnormalities of insertional activity at all 3 levels tested. There was good relaxation.   IMPRESSION:  Nerve conduction studies done on both lower extremities were within normal limits. No evidence of a peripheral neuropathy is seen. EMG evaluation of both lower extremities reveals no significant abnormalities, there was no evidence of a lumbosacral radiculopathy on either side.  Marlan Palau. Keith Aurianna Earlywine MD 01/25/2015 11:47 AM  [facility] [address] [place], [place] [id number]  Phone [phone] Fax [phone]

## 2015-01-25 NOTE — Progress Notes (Signed)
Please refer to EMG and nerve conduction study procedure note. 

## 2015-02-12 ENCOUNTER — Other Ambulatory Visit (HOSPITAL_COMMUNITY)
Admission: RE | Admit: 2015-02-12 | Discharge: 2015-02-12 | Disposition: A | Discharge status: Home / Self Care | Payer: 59 | Source: Ambulatory Visit | Attending: Family Medicine | Admitting: Family Medicine

## 2015-02-12 ENCOUNTER — Other Ambulatory Visit: Payer: Self-pay | Admitting: Physician Assistant

## 2015-02-12 DIAGNOSIS — Z124 Encounter for screening for malignant neoplasm of cervix: Secondary | ICD-10-CM | POA: Insufficient documentation

## 2015-02-16 LAB — CYTOLOGY - PAP

## 2015-05-04 DIAGNOSIS — H2513 Age-related nuclear cataract, bilateral: Secondary | ICD-10-CM | POA: Diagnosis not present

## 2015-05-04 DIAGNOSIS — H4011X3 Primary open-angle glaucoma, severe stage: Secondary | ICD-10-CM | POA: Diagnosis not present

## 2015-07-25 ENCOUNTER — Encounter (HOSPITAL_COMMUNITY): Payer: Self-pay

## 2015-07-25 ENCOUNTER — Emergency Department (HOSPITAL_COMMUNITY): Payer: 59

## 2015-07-25 ENCOUNTER — Emergency Department (HOSPITAL_COMMUNITY)
Admission: EM | Admit: 2015-07-25 | Discharge: 2015-07-26 | Disposition: A | Discharge status: Home / Self Care | Payer: 59 | Attending: Emergency Medicine | Admitting: Emergency Medicine

## 2015-07-25 DIAGNOSIS — S29019A Strain of muscle and tendon of unspecified wall of thorax, initial encounter: Secondary | ICD-10-CM

## 2015-07-25 DIAGNOSIS — Y9389 Activity, other specified: Secondary | ICD-10-CM | POA: Diagnosis not present

## 2015-07-25 DIAGNOSIS — Z7951 Long term (current) use of inhaled steroids: Secondary | ICD-10-CM | POA: Diagnosis not present

## 2015-07-25 DIAGNOSIS — J45909 Unspecified asthma, uncomplicated: Secondary | ICD-10-CM | POA: Diagnosis not present

## 2015-07-25 DIAGNOSIS — Z79899 Other long term (current) drug therapy: Secondary | ICD-10-CM | POA: Insufficient documentation

## 2015-07-25 DIAGNOSIS — S29012A Strain of muscle and tendon of back wall of thorax, initial encounter: Secondary | ICD-10-CM | POA: Diagnosis not present

## 2015-07-25 DIAGNOSIS — M419 Scoliosis, unspecified: Secondary | ICD-10-CM | POA: Insufficient documentation

## 2015-07-25 DIAGNOSIS — I10 Essential (primary) hypertension: Secondary | ICD-10-CM | POA: Insufficient documentation

## 2015-07-25 DIAGNOSIS — S199XXA Unspecified injury of neck, initial encounter: Secondary | ICD-10-CM | POA: Diagnosis present

## 2015-07-25 DIAGNOSIS — K219 Gastro-esophageal reflux disease without esophagitis: Secondary | ICD-10-CM | POA: Insufficient documentation

## 2015-07-25 DIAGNOSIS — R011 Cardiac murmur, unspecified: Secondary | ICD-10-CM | POA: Diagnosis not present

## 2015-07-25 DIAGNOSIS — Y92410 Unspecified street and highway as the place of occurrence of the external cause: Secondary | ICD-10-CM | POA: Diagnosis not present

## 2015-07-25 DIAGNOSIS — S161XXA Strain of muscle, fascia and tendon at neck level, initial encounter: Secondary | ICD-10-CM | POA: Diagnosis not present

## 2015-07-25 DIAGNOSIS — Y998 Other external cause status: Secondary | ICD-10-CM | POA: Diagnosis not present

## 2015-07-25 DIAGNOSIS — Z8669 Personal history of other diseases of the nervous system and sense organs: Secondary | ICD-10-CM | POA: Diagnosis not present

## 2015-07-25 DIAGNOSIS — H409 Unspecified glaucoma: Secondary | ICD-10-CM | POA: Insufficient documentation

## 2015-07-25 DIAGNOSIS — Z720 Tobacco use: Secondary | ICD-10-CM | POA: Diagnosis not present

## 2015-07-25 DIAGNOSIS — S39012A Strain of muscle, fascia and tendon of lower back, initial encounter: Secondary | ICD-10-CM | POA: Insufficient documentation

## 2015-07-25 DIAGNOSIS — E785 Hyperlipidemia, unspecified: Secondary | ICD-10-CM | POA: Insufficient documentation

## 2015-07-25 DIAGNOSIS — Z8701 Personal history of pneumonia (recurrent): Secondary | ICD-10-CM | POA: Diagnosis not present

## 2015-07-25 DIAGNOSIS — Z872 Personal history of diseases of the skin and subcutaneous tissue: Secondary | ICD-10-CM | POA: Insufficient documentation

## 2015-07-25 NOTE — ED Notes (Signed)
Patient transported to X-ray 
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
## 2015-07-26 MED ORDER — HYDROCODONE-ACETAMINOPHEN 5-325 MG PO TABS: 1.0000 | ORAL_TABLET | Freq: Four times a day (QID) | ORAL | Status: AC | PRN

## 2015-07-26 NOTE — ED Notes (Signed)
MD at bedside. 
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
## 2015-08-02 DIAGNOSIS — Z23 Encounter for immunization: Secondary | ICD-10-CM | POA: Diagnosis not present

## 2015-08-02 DIAGNOSIS — M545 Low back pain: Secondary | ICD-10-CM | POA: Diagnosis not present

## 2015-08-10 DIAGNOSIS — M545 Low back pain: Secondary | ICD-10-CM | POA: Diagnosis not present

## 2015-08-10 DIAGNOSIS — M542 Cervicalgia: Secondary | ICD-10-CM | POA: Diagnosis not present

## 2015-08-10 DIAGNOSIS — M546 Pain in thoracic spine: Secondary | ICD-10-CM | POA: Diagnosis not present

## 2015-08-10 DIAGNOSIS — S32038A Other fracture of third lumbar vertebra, initial encounter for closed fracture: Secondary | ICD-10-CM | POA: Diagnosis not present

## 2015-08-18 DIAGNOSIS — K219 Gastro-esophageal reflux disease without esophagitis: Secondary | ICD-10-CM | POA: Diagnosis not present

## 2015-08-18 DIAGNOSIS — Z720 Tobacco use: Secondary | ICD-10-CM | POA: Diagnosis not present

## 2015-08-18 DIAGNOSIS — E7800 Pure hypercholesterolemia, unspecified: Secondary | ICD-10-CM | POA: Diagnosis not present

## 2015-08-18 DIAGNOSIS — I10 Essential (primary) hypertension: Secondary | ICD-10-CM | POA: Diagnosis not present

## 2015-09-02 DIAGNOSIS — M542 Cervicalgia: Secondary | ICD-10-CM | POA: Diagnosis not present

## 2015-09-02 DIAGNOSIS — M545 Low back pain: Secondary | ICD-10-CM | POA: Diagnosis not present

## 2015-09-09 DIAGNOSIS — M545 Low back pain: Secondary | ICD-10-CM | POA: Diagnosis not present

## 2015-09-09 DIAGNOSIS — M542 Cervicalgia: Secondary | ICD-10-CM | POA: Diagnosis not present

## 2015-09-15 DIAGNOSIS — M542 Cervicalgia: Secondary | ICD-10-CM | POA: Diagnosis not present

## 2015-09-15 DIAGNOSIS — M545 Low back pain: Secondary | ICD-10-CM | POA: Diagnosis not present

## 2016-06-29 ENCOUNTER — Emergency Department (HOSPITAL_COMMUNITY): Payer: Commercial Managed Care - HMO

## 2016-06-29 ENCOUNTER — Emergency Department (HOSPITAL_COMMUNITY)
Admission: EM | Admit: 2016-06-29 | Discharge: 2016-06-29 | Disposition: A | Discharge status: Home / Self Care | Payer: Commercial Managed Care - HMO | Attending: Emergency Medicine | Admitting: Emergency Medicine

## 2016-06-29 ENCOUNTER — Encounter (HOSPITAL_COMMUNITY): Payer: Self-pay

## 2016-06-29 DIAGNOSIS — Z96642 Presence of left artificial hip joint: Secondary | ICD-10-CM | POA: Diagnosis not present

## 2016-06-29 DIAGNOSIS — J45909 Unspecified asthma, uncomplicated: Secondary | ICD-10-CM | POA: Diagnosis not present

## 2016-06-29 DIAGNOSIS — J189 Pneumonia, unspecified organism: Secondary | ICD-10-CM | POA: Diagnosis not present

## 2016-06-29 DIAGNOSIS — J029 Acute pharyngitis, unspecified: Secondary | ICD-10-CM | POA: Diagnosis present

## 2016-06-29 DIAGNOSIS — I10 Essential (primary) hypertension: Secondary | ICD-10-CM | POA: Insufficient documentation

## 2016-06-29 DIAGNOSIS — F17210 Nicotine dependence, cigarettes, uncomplicated: Secondary | ICD-10-CM | POA: Insufficient documentation

## 2016-06-29 LAB — RAPID STREP SCREEN (MED CTR MEBANE ONLY): Streptococcus, Group A Screen (Direct): NEGATIVE

## 2016-06-29 MED ORDER — AMOXICILLIN 500 MG PO CAPS: 500.0000 mg | ORAL_CAPSULE | Freq: Three times a day (TID) | ORAL | 0 refills | Status: AC

## 2016-06-29 MED ORDER — AZITHROMYCIN 250 MG PO TABS: 250.0000 mg | ORAL_TABLET | Freq: Every day | ORAL | 0 refills | Status: AC

## 2016-06-29 MED ORDER — IBUPROFEN 400 MG PO TABS
600.0000 mg | ORAL_TABLET | Freq: Once | ORAL | Status: AC
  Administered 2016-06-29: 600 mg via ORAL
  Filled 2016-06-29: qty 1

## 2016-06-29 NOTE — ED Notes (Signed)
States throat feels better given ginger ale wants a cracker

## 2016-06-29 NOTE — ED Notes (Signed)
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

## 2016-06-29 NOTE — Discharge Instructions (Signed)
Follow-up with your doctor to make sure the pneumonia has cleared, take the antibiotics as prescribed; return as needed for worsening symptoms

## 2016-06-29 NOTE — ED Provider Notes (Signed)
[facility]-EMERGENCY DEPT Provider Note   CSN: [id number] Arrival date & time: 06/29/16  4098     History   Chief Complaint Chief Complaint  Patient presents with  . Sore Throat    HPI Annette Andrade is a 60 y.o. female.  HPI Pt started with a sore throat yesterday.  She has been aching and has felt chilled.  She has been coughing.  Last night she did not sleep well and otc meds were not helping so she came to the ED.  She figured she might need an xray so she did not see her doctor. Past Medical History:  Diagnosis Date  . Asthma   . Eczema   . GERD (gastroesophageal reflux disease)   . Glaucoma    bilateral  . Heart murmur   . Hyperlipidemia   . Hypertension   . Insomnia   . Mitral valve prolapse   . Pneumonia    hx of  . Scoliosis   . Shortness of breath     Patient Active Problem List   Diagnosis Date Noted  . Avascular necrosis of bone of left hip (HCC) 07/07/2013  . Avascular necrosis of bones of both hips (HCC) 12/24/2012  . Hyperlipidemia 12/13/2011  . Asthma 12/13/2011  . Essential hypertension, benign 12/13/2011  . 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 DYSLIPIDEMIA 07/29/2007  . TOBACCO USER 07/29/2007  . GLAUCOMA NOS 07/29/2007  . HYPERTENSION 07/29/2007  . MITRAL VALVE PROLAPSE 07/29/2007  . SCOLIOSIS NEC 07/29/2007    Past Surgical History:  Procedure Laterality Date  . ANAL FISSURE REPAIR    . FINGER SURGERY  60 years old   does not remember why, also has scar on side of abdomen  . TOTAL HIP ARTHROPLASTY Left 07/07/2013   Procedure: LEFT TOTAL HIP ARTHROPLASTY;  Surgeon: [name], MD;  Location: [facility] OR;  Service: Orthopedics;  Laterality: Left;    OB History    No data available       Home Medications    Prior to Admission medications   Medication Sig Start Date End Date Taking? Authorizing Provider  albuterol (PROVENTIL HFA;VENTOLIN HFA) 108 (90 BASE) MCG/ACT inhaler Inhale 4 puffs into the lungs every 6 (six) hours as needed  for wheezing. 12/30/12  Yes Christina P Rama, MD  ALPHAGAN P 0.1 % SOLN Place 1 drop into both eyes 2 (two) times daily. 05/02/16  Yes Historical Provider, MD  Ascorbic Acid (VITAMIN C) 1000 MG tablet Take 1,000 mg by mouth at bedtime.    Yes Historical Provider, MD  b complex vitamins tablet Take 1 tablet by mouth daily.   Yes Historical Provider, MD  cyclobenzaprine (FLEXERIL) 5 MG tablet Take 5 mg by mouth 3 (three) times daily as needed for muscle spasms.   Yes Historical Provider, MD  dorzolamide (TRUSOPT) 2 % ophthalmic solution Place 1 drop into both eyes 2 (two) times daily.   Yes Historical Provider, MD  latanoprost (XALATAN) 0.005 % ophthalmic solution Place 1 drop into both eyes at bedtime.   Yes Historical Provider, MD  lisinopril-hydrochlorothiazide (PRINZIDE,ZESTORETIC) 20-25 MG per tablet Take 1 tablet by mouth daily.   Yes Historical Provider, MD  omeprazole (PRILOSEC) 20 MG capsule Take 20 mg by mouth daily. 10/19/12  Yes Nelva Nay, MD  ranitidine (ZANTAC) 150 MG tablet Take 1 tablet (150 mg total) by mouth 2 (two) times daily. 11/11/12  Yes Clanford Cyndie Mull, MD  simvastatin (ZOCOR) 40 MG tablet Take 40 mg by mouth every  evening.   Yes Historical Provider, MD  amoxicillin (AMOXIL) 500 MG capsule Take 1 capsule (500 mg total) by mouth 3 (three) times daily. 06/29/16   Linwood Dibbles, MD  azithromycin (ZITHROMAX) 250 MG tablet Take 1 tablet (250 mg total) by mouth daily. Take first 2 tablets together, then 1 every day until finished. 06/29/16   Linwood Dibbles, MD  Calcium Carbonate-Vitamin D (CALCIUM-VITAMIN D) 500-200 MG-UNIT per tablet Take 1 tablet by mouth daily. Patient not taking: Reported on 06/29/2016 12/30/12   Maryruth Bun Rama, MD  guaiFENesin-dextromethorphan (ROBITUSSIN DM) 100-10 MG/5ML syrup Take 5 mLs by mouth every 4 (four) hours as needed for cough. Patient not taking: Reported on 06/29/2016 10/16/13   Jaynie Crumble, PA-C  HYDROcodone-acetaminophen (NORCO) 5-325 MG tablet Take  1-2 tablets by mouth every 6 (six) hours as needed. Patient not taking: Reported on 06/29/2016 07/26/15   Geoffery Lyons, MD  metroNIDAZOLE (FLAGYL) 500 MG tablet Take 1 tablet (500 mg total) by mouth 2 (two) times daily. Patient not taking: Reported on 06/29/2016 03/07/14   Reuben Likes, MD  oseltamivir (TAMIFLU) 75 MG capsule Take 1 capsule (75 mg total) by mouth every 12 (twelve) hours. Patient not taking: Reported on 06/29/2016 10/16/13   Lemont Fillers Kirichenko, PA-C  predniSONE (DELTASONE) 10 MG tablet Take 5 tab day 1, take 4 tab day 2, take 3 tab day 3, take 2 tab day 4, and take 1 tab day 5 Patient not taking: Reported on 06/29/2016 10/16/13   Jaynie Crumble, PA-C    Family History Family History  Problem Relation Age of Onset  . Stroke Mother   . Heart attack Father   . Colon cancer Paternal Uncle   . Throat cancer Maternal Aunt     Social History Social History  Substance Use Topics  . Smoking status: Current Every Day Smoker    Packs/day: 0.50    Years: 40.00    Types: Cigarettes  . Smokeless tobacco: Never Used  . Alcohol use Yes     Comment: rarely     Allergies   Review of patient's allergies indicates no known allergies.   Review of Systems Review of Systems  Constitutional: Negative for fever.  Respiratory: Positive for cough.   Gastrointestinal: Negative for diarrhea and vomiting.  Genitourinary: Negative for dysuria.  All other systems reviewed and are negative.    Physical Exam Updated Vital Signs BP 136/78 (BP Location: Left Arm)   Pulse 100   Temp 98.5 F (36.9 C) (Oral)   Resp 18   Ht 5\' 7"  (1.702 m)   Wt 67 kg   SpO2 99%   BMI 23.13 kg/m   Physical Exam  Constitutional: She appears well-developed and well-nourished. No distress.  HENT:  Head: Normocephalic and atraumatic.  Right Ear: Tympanic membrane and external ear normal.  Left Ear: Tympanic membrane and external ear normal.  Mouth/Throat: Oropharynx is clear and moist. No  oropharyngeal exudate.  Eyes: Conjunctivae are normal. Right eye exhibits no discharge. Left eye exhibits no discharge. No scleral icterus.  Neck: Neck supple. No tracheal deviation present.  Cardiovascular: Normal rate, regular rhythm and intact distal pulses.   Pulmonary/Chest: Effort normal. No stridor. No respiratory distress. She has no wheezes (few left greater than right base). She has rales.  Abdominal: Soft. Bowel sounds are normal. She exhibits no distension. There is no tenderness. There is no rebound and no guarding.  Musculoskeletal: She exhibits no edema or tenderness.  Neurological: She is alert. She has normal strength.  No cranial nerve deficit (no facial droop, extraocular movements intact, no slurred speech) or sensory deficit. She exhibits normal muscle tone. She displays no seizure activity. Coordination normal.  Skin: Skin is warm and dry. No rash noted.  Psychiatric: She has a normal mood and affect.  Nursing note and vitals reviewed.    ED Treatments / Results  Labs (all labs ordered are listed, but only abnormal results are displayed) Labs Reviewed  RAPID STREP SCREEN (NOT AT [facility])  CULTURE, GROUP A STREP [facility])    EKG  EKG Interpretation None       Radiology Dg Chest 2 View  Result Date: 06/29/2016 CLINICAL DATA:  Cough. EXAM: CHEST  2 VIEW COMPARISON:  07/25/2015.  10/16/2013. FINDINGS: Mediastinum and hilar structures normal. Mild right base infiltrate noted, mild pneumonia cannot be excluded. No pleural effusion pneumothorax. Heart size normal. Prominent thoracic spine scoliosis . IMPRESSION: Mild right base infiltrate, mild pneumonia cannot be excluded. Electronically Signed   By: Maisie Fushomas  Register   On: 06/29/2016 11:15    Procedures Procedures (including critical care time)  Medications Ordered in ED Medications  ibuprofen (ADVIL,MOTRIN) tablet 600 mg (600 mg Oral Given 06/29/16 1040)     Initial Impression / Assessment and Plan / ED Course  I  have reviewed the triage vital signs and the nursing notes.  Pertinent labs & imaging results that were available during my care of the patient were reviewed by me and considered in my medical decision making (see chart for details).  Clinical Course    Possible pna on cxr.  Pt appears stable.  No distress.  No oxygen requirement.  Will dc home with abx.  Follow up with pcp  Final Clinical Impressions(s) / ED Diagnoses   Final diagnoses:  CAP (community acquired pneumonia)    New Prescriptions New Prescriptions   AMOXICILLIN (AMOXIL) 500 MG CAPSULE    Take 1 capsule (500 mg total) by mouth 3 (three) times daily.   AZITHROMYCIN (ZITHROMAX) 250 MG TABLET    Take 1 tablet (250 mg total) by mouth daily. Take first 2 tablets together, then 1 every day until finished.     Linwood DibblesJon Nohelani Benning, MD 06/29/16 1324

## 2016-06-29 NOTE — ED Triage Notes (Signed)
Pt. Reports having cough, sore throat and chills.  Pt. Has pain in her throat and upper chest only when she cough.s  No distress noted. Throat is red, airway patent

## 2016-06-29 NOTE — ED Notes (Signed)
Back of throat red and swollen

## 2016-07-01 LAB — CULTURE, GROUP A STREP (THRC)

## 2016-12-27 DIAGNOSIS — F331 Major depressive disorder, recurrent, moderate: Secondary | ICD-10-CM | POA: Diagnosis not present
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
## 2022-06-13 ENCOUNTER — Inpatient Hospital Stay: Admit: 2022-06-13 | Discharge: 2022-06-13 | Payer: PRIVATE HEALTH INSURANCE

## 2022-06-13 ENCOUNTER — Encounter: Admit: 2022-06-13 | Payer: PRIVATE HEALTH INSURANCE

## 2022-06-13 ENCOUNTER — Emergency Department: Admit: 2022-06-13 | Payer: PRIVATE HEALTH INSURANCE

## 2022-06-13 DIAGNOSIS — M549 Dorsalgia, unspecified: Secondary | ICD-10-CM

## 2022-06-13 DIAGNOSIS — I10 Essential (primary) hypertension: Secondary | ICD-10-CM

## 2022-06-13 DIAGNOSIS — E7800 Pure hypercholesterolemia, unspecified: Secondary | ICD-10-CM

## 2022-06-13 DIAGNOSIS — E119 Type 2 diabetes mellitus without complications: Secondary | ICD-10-CM

## 2022-06-13 DIAGNOSIS — S9032XA Contusion of left foot, initial encounter: Principal | ICD-10-CM

## 2022-06-13 DIAGNOSIS — M79672 Pain in left foot: Secondary | ICD-10-CM

## 2022-06-13 MED ORDER — ASCORBIC ACID (VITAMIN C) 100 MG TABLET
100 mg | Freq: Every day | ORAL | Status: AC
Start: 2022-06-13 — End: ?

## 2022-06-13 MED ORDER — GABAPENTIN 100 MG CAPSULE
100 mg | Freq: Three times a day (TID) | ORAL | Status: AC
Start: 2022-06-13 — End: ?

## 2022-06-13 MED ORDER — DORZOLAMIDE 2 % EYE DROPS
2 % | Freq: Three times a day (TID) | OPHTHALMIC | Status: AC
Start: 2022-06-13 — End: ?

## 2022-06-13 MED ORDER — LISINOPRIL 20 MG TABLET
20 mg | Freq: Every day | ORAL | Status: AC
Start: 2022-06-13 — End: ?

## 2022-06-13 MED ORDER — BACLOFEN 20 MG TABLET
20 mg | Freq: Two times a day (BID) | ORAL | Status: AC
Start: 2022-06-13 — End: ?

## 2022-06-13 MED ORDER — BRIMONIDINE 0.1 % EYE DROPS
0.1 % | Freq: Three times a day (TID) | Status: AC
Start: 2022-06-13 — End: ?

## 2022-06-13 MED ORDER — PANTOPRAZOLE 40 MG TABLET,DELAYED RELEASE
40 mg | Freq: Every day | ORAL | Status: AC
Start: 2022-06-13 — End: ?

## 2022-06-13 MED ORDER — CARVEDILOL IMMEDIATE RELEASE 3.125 MG TABLET
3.125 mg | Freq: Two times a day (BID) | ORAL | Status: AC
Start: 2022-06-13 — End: ?

## 2022-06-13 MED ORDER — TUMERIC 100 MG-GINGER 150 MG-OLIVE 50 MG-OREG 150 MG-CAPRYLATE CAPSULE
100 mg-150 mg- 50 mg-150 mg | ORAL | Status: AC
Start: 2022-06-13 — End: ?

## 2022-06-13 MED ORDER — HYDROCODONE 7.5 MG-ACETAMINOPHEN 325 MG/15 ML ORAL SOLUTION
Freq: Three times a day (TID) | ORAL | Status: AC
Start: 2022-06-13 — End: ?

## 2022-06-13 MED ORDER — MULTIVITAMIN CAPSULE
Freq: Every day | ORAL | Status: AC
Start: 2022-06-13 — End: ?

## 2022-06-13 MED ORDER — TRELEGY ELLIPTA INHL
RESPIRATORY_TRACT | Status: AC | PRN
Start: 2022-06-13 — End: ?

## 2022-06-13 MED ORDER — FLAXSEED OIL
Status: AC
Start: 2022-06-13 — End: ?

## 2022-06-13 MED ORDER — CETIRIZINE 1 MG/ML ORAL SOLUTION
1 mg/mL | Freq: Every day | ORAL | Status: AC
Start: 2022-06-13 — End: ?

## 2022-06-13 MED ORDER — METFORMIN ER 500 MG 24 HR TABLET,EXTENDED RELEASE (GASTRIC RETENTION)
500 mg | Freq: Two times a day (BID) | ORAL | Status: AC
Start: 2022-06-13 — End: ?

## 2022-06-13 MED ORDER — ATORVASTATIN 40 MG TABLET
40 mg | Freq: Every day | ORAL | Status: AC
Start: 2022-06-13 — End: ?

## 2022-06-13 MED ORDER — ACETAMINOPHEN 500 MG TABLET
500 mg | Freq: Once | ORAL | Status: CP
Start: 2022-06-13 — End: ?
  Administered 2022-06-13: 16:00:00 500 mg via ORAL

## 2022-06-13 MED ORDER — CALCIUM CARBONATE 500 MG-VITAMIN D3 5 MCG (200 UNIT) TABLET
1250 mg (500 mg calcium) - 200 unit | Freq: Two times a day (BID) | ORAL | Status: AC
Start: 2022-06-13 — End: ?

## 2022-06-13 NOTE — ED Notes
Pt d/c instructions given by provider verbally with written materials, Patient understands discharge instructions. pt in wheelchair. Discharge to home. Discharged by PA Hyacinth Meeker

## 2022-06-13 NOTE — Discharge Instructions
As discussed, did not see any evidence of fracture or dislocation on your x-ray.  You likely have a bruise of the bone.  Apply ice to the affected area.  Take Motrin or Tylenol at home as needed for symptoms.  We have also placed an Ace wrap to the affected area.  Follow-up with your PCP/establish care with a PCP with referral provided.  Return with any new or worsening symptoms.

## 2022-06-13 NOTE — ED Notes
Provider at bedside

## 2022-06-13 NOTE — ED Provider Notes
Chief Complaint Patient presents with ? Foot Pain   Pt states yesterday she dropped a aerosol can on her left foot and since then having pain to the foot.  HPI/PE:Patient comes in for evaluation of left foot pain.  Dropped an aerosol can on her left foot yesterday.  Reports pain to the top of her foot and difficulty walking.  Does have a cane at home that she uses.  Denies any other injuries sustained.  Denies any other symptoms at this time.Patient is well-appearing on exam and in no acute distress.  Vital signs are stable.  X-ray of the left foot was obtained which was negative for acute fracture or dislocation per my independent interpretation.  Patient likely with contusion.  Given Tylenol while here in the ED for pain symptoms and advised to apply ice the affected area and take Tylenol as needed for pain symptoms at home.  Will use her cane to help with ambulation at home.  No wounds noted to the foot today.  Advised to follow up with patient's PCP in regards to symptoms and return with any new or worsening of symptoms.  Patient was agreeable to this plan of action and was given the opportunity to ask questions.Patient was independently evaluated by me as delegated by my supervising physician.  Physical ExamED Triage Vitals [06/13/22 1113]BP: 110/65Pulse: 89Pulse from  O2 sat: n/aResp: 18Temp: 98.6 ?F (37 ?C)Temp src: OralSpO2: 98 % BP 110/65  - Pulse 89  - Temp 98.6 ?F (37 ?C) (Oral)  - Resp 18  - SpO2 98% Physical ExamConstitutional:     Appearance: Normal appearance. Cardiovascular:    Rate and Rhythm: Normal rate. Pulmonary:    Effort: Pulmonary effort is normal. Musculoskeletal:    Comments: ttp over the mid metatarsals of the lateral aspect of the left foot. No swelling noted. 2+ dorsalis pedis pulse.  No tenderness to palpation over the left ankle.  Left lower extremity is distally neurovascularly intact with adequate sensation. Skin:   General: Skin is warm and dry.    Comments: No wounds to left foot noted. Neurological:    Mental Status: She is alert and oriented to person, place, and time. Psychiatric:       Mood and Affect: Mood normal.       Behavior: Behavior normal.  ProceduresAttestation/Critical CareClinical Impressions as of 06/13/22 1211 Contusion of plantar aspect of left foot, initial encounter  ED DispositionDischarge Milana Kidney, PA08/22/23 1211
